# Patient Record
Sex: Female | Born: 1995 | Race: White | Hispanic: No | Smoking: Current every day smoker
Health system: Southern US, Community
[De-identification: ages and names within clinical notes are randomized; demographics above are authoritative.]

## PROBLEM LIST (undated history)

## (undated) ENCOUNTER — Inpatient Hospital Stay: Payer: Self-pay

## (undated) DIAGNOSIS — B009 Herpesviral infection, unspecified: Secondary | ICD-10-CM

## (undated) DIAGNOSIS — S92909A Unspecified fracture of unspecified foot, initial encounter for closed fracture: Secondary | ICD-10-CM

## (undated) DIAGNOSIS — J189 Pneumonia, unspecified organism: Secondary | ICD-10-CM

## (undated) DIAGNOSIS — R519 Headache, unspecified: Secondary | ICD-10-CM

## (undated) DIAGNOSIS — R51 Headache: Secondary | ICD-10-CM

## (undated) DIAGNOSIS — F1910 Other psychoactive substance abuse, uncomplicated: Secondary | ICD-10-CM

## (undated) HISTORY — PX: VAGINAL WOUND CLOSURE / REPAIR: SUR258

## (undated) HISTORY — DX: Herpesviral infection, unspecified: B00.9

## (undated) HISTORY — PX: WISDOM TOOTH EXTRACTION: SHX21

---
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
## 2014-09-24 ENCOUNTER — Emergency Department: Payer: Self-pay | Admitting: Student

## 2014-09-24 LAB — GC/CHLAMYDIA PROBE AMP

## 2014-09-24 LAB — WET PREP, GENITAL

## 2015-02-21 DIAGNOSIS — G43909 Migraine, unspecified, not intractable, without status migrainosus: Secondary | ICD-10-CM | POA: Diagnosis not present

## 2015-02-21 DIAGNOSIS — J019 Acute sinusitis, unspecified: Secondary | ICD-10-CM | POA: Insufficient documentation

## 2015-02-21 DIAGNOSIS — M791 Myalgia: Secondary | ICD-10-CM | POA: Insufficient documentation

## 2015-02-21 DIAGNOSIS — Z720 Tobacco use: Secondary | ICD-10-CM | POA: Diagnosis not present

## 2015-02-21 DIAGNOSIS — Z91040 Latex allergy status: Secondary | ICD-10-CM | POA: Insufficient documentation

## 2015-02-21 DIAGNOSIS — J029 Acute pharyngitis, unspecified: Secondary | ICD-10-CM | POA: Diagnosis present

## 2015-02-22 ENCOUNTER — Emergency Department: Payer: Medicaid Other

## 2015-02-22 ENCOUNTER — Emergency Department
Admission: EM | Admit: 2015-02-22 | Discharge: 2015-02-22 | Disposition: A | Discharge status: Home / Self Care | Payer: Medicaid Other | Attending: Emergency Medicine | Admitting: Emergency Medicine

## 2015-02-22 ENCOUNTER — Encounter: Payer: Self-pay | Admitting: Urgent Care

## 2015-02-22 DIAGNOSIS — M791 Myalgia, unspecified site: Secondary | ICD-10-CM

## 2015-02-22 DIAGNOSIS — J019 Acute sinusitis, unspecified: Secondary | ICD-10-CM

## 2015-02-22 DIAGNOSIS — G43909 Migraine, unspecified, not intractable, without status migrainosus: Secondary | ICD-10-CM

## 2015-02-22 LAB — CBC WITH DIFFERENTIAL/PLATELET
BASOS PCT: 1 %
Basophils Absolute: 0 10*3/uL (ref 0–0.1)
Eosinophils Absolute: 0 10*3/uL (ref 0–0.7)
Eosinophils Relative: 0 %
HCT: 40.4 % (ref 35.0–47.0)
HEMOGLOBIN: 13 g/dL (ref 12.0–16.0)
Lymphocytes Relative: 15 %
Lymphs Abs: 0.8 10*3/uL — ABNORMAL LOW (ref 1.0–3.6)
MCH: 28.8 pg (ref 26.0–34.0)
MCHC: 32.1 g/dL (ref 32.0–36.0)
MCV: 89.7 fL (ref 80.0–100.0)
MONO ABS: 0.7 10*3/uL (ref 0.2–0.9)
Monocytes Relative: 12 %
NEUTROS PCT: 72 %
Neutro Abs: 4.2 10*3/uL (ref 1.4–6.5)
PLATELETS: 226 10*3/uL (ref 150–440)
RBC: 4.5 MIL/uL (ref 3.80–5.20)
RDW: 13.5 % (ref 11.5–14.5)
WBC: 5.7 10*3/uL (ref 3.6–11.0)

## 2015-02-22 LAB — URINALYSIS COMPLETE WITH MICROSCOPIC (ARMC ONLY)
BILIRUBIN URINE: NEGATIVE
Glucose, UA: NEGATIVE mg/dL
LEUKOCYTES UA: NEGATIVE
Nitrite: NEGATIVE
PROTEIN: 30 mg/dL — AB
Specific Gravity, Urine: 1.025 (ref 1.005–1.030)
pH: 5 (ref 5.0–8.0)

## 2015-02-22 LAB — COMPREHENSIVE METABOLIC PANEL
ALK PHOS: 91 U/L (ref 38–126)
ALT: 49 U/L (ref 14–54)
AST: 34 U/L (ref 15–41)
Albumin: 4.4 g/dL (ref 3.5–5.0)
Anion gap: 8 (ref 5–15)
BUN: 9 mg/dL (ref 6–20)
CO2: 26 mmol/L (ref 22–32)
CREATININE: 0.72 mg/dL (ref 0.44–1.00)
Calcium: 9.2 mg/dL (ref 8.9–10.3)
Chloride: 105 mmol/L (ref 101–111)
GFR calc Af Amer: >60 mL/min (ref >60)
GLUCOSE: 128 mg/dL — AB (ref 65–99)
Potassium: 3.6 mmol/L (ref 3.5–5.1)
SODIUM: 139 mmol/L (ref 135–145)
Total Bilirubin: 0.2 mg/dL — ABNORMAL LOW (ref 0.3–1.2)
Total Protein: 8 g/dL (ref 6.5–8.1)

## 2015-02-22 LAB — TROPONIN I

## 2015-02-22 MED ORDER — METOCLOPRAMIDE HCL 5 MG/ML IJ SOLN
10.0000 mg | Freq: Once | INTRAMUSCULAR | Status: AC
  Administered 2015-02-22: 10 mg via INTRAVENOUS

## 2015-02-22 MED ORDER — DIPHENHYDRAMINE HCL 50 MG/ML IJ SOLN
INTRAMUSCULAR | Status: AC
  Administered 2015-02-22: 12.5 mg via INTRAVENOUS
  Filled 2015-02-22: qty 1

## 2015-02-22 MED ORDER — METOCLOPRAMIDE HCL 5 MG/ML IJ SOLN
INTRAMUSCULAR | Status: AC
  Administered 2015-02-22: 10 mg via INTRAVENOUS
  Filled 2015-02-22: qty 2

## 2015-02-22 MED ORDER — TRAMADOL HCL 50 MG PO TABS: 50.0000 mg | ORAL_TABLET | Freq: Four times a day (QID) | ORAL | Status: AC | PRN

## 2015-02-22 MED ORDER — AMOXICILLIN-POT CLAVULANATE 875-125 MG PO TABS: 1.0000 | ORAL_TABLET | Freq: Two times a day (BID) | ORAL | Status: AC

## 2015-02-22 MED ORDER — KETOROLAC TROMETHAMINE 30 MG/ML IJ SOLN
INTRAMUSCULAR | Status: AC
  Administered 2015-02-22: 30 mg via INTRAVENOUS
  Filled 2015-02-22: qty 1

## 2015-02-22 MED ORDER — DIPHENHYDRAMINE HCL 50 MG/ML IJ SOLN
12.5000 mg | Freq: Once | INTRAMUSCULAR | Status: AC
  Administered 2015-02-22: 12.5 mg via INTRAVENOUS

## 2015-02-22 MED ORDER — KETOROLAC TROMETHAMINE 30 MG/ML IJ SOLN
30.0000 mg | Freq: Once | INTRAMUSCULAR | Status: AC
  Administered 2015-02-22: 30 mg via INTRAVENOUS

## 2015-02-22 MED ORDER — SODIUM CHLORIDE 0.9 % IV BOLUS (SEPSIS)
1000.0000 mL | Freq: Once | INTRAVENOUS | Status: AC
  Administered 2015-02-22: 1000 mL via INTRAVENOUS

## 2015-02-22 NOTE — ED Notes (Signed)
Patient presents to ED with c/o cough, shortness of breath, light headed, fevers, and generalized weakness x 5 days. Patient reports has productive cough with white, clear, or at times pink sputum. Patient reports family member also being sick with similar symptoms. Patient also c/o nausea, vomiting, and diarrhea, reports at time had blood in stool. Patient alert and oriented, respirations even and unlabored. Patient + for congested cough. Denies blurry vision, or other symptoms at this time.

## 2015-02-22 NOTE — ED Provider Notes (Signed)
[facility] Emergency Department Provider Note  ____________________________________________  Time seen: Approximately 5:06 AM  I have reviewed the triage vital signs and the nursing notes.   HISTORY  Chief Complaint Cough; Nasal Congestion; Sore Throat; Generalized Body Aches; Hematemesis; and Melena    HPI Tiffany Sherman is a [age] y.o. female who comes in with 6 days of congestion sore throat and body aches. She reports that today she developed some blood in her stool vomiting and coughing up blood. The patient reports that whenever she coughs she would have chunks of blood in it and that whenever she vomited it was pink tissue noted was blood. The patient reports that she vomits every time she eats and she has been unable to eat anything. The patient took some Benadryl yesterday to try to sleep and took some ibuprofen 500 mg 2 doses which did not help. The patient reports that she is unable to sleep. At home her temperature was 103. Her by group is no reports that it hurts to do anything. [facility] for [facility] appointment and was told that she could not be seen until next week. The patient reports that she has some shortness of breath and chest pain and it hurts when she coughs and breathes. She has had some vomiting and diarrhea. She reports that her abdomen hurts all over and that she has sharper pain under her left rib. She reports her cousin is also sick.   History reviewed. No pertinent past medical history.  There are no active problems to display for this patient.   History reviewed. No pertinent past surgical history.  Current Outpatient Rx  Name  Route  Sig  Dispense  Refill  . amoxicillin-clavulanate (AUGMENTIN) 875-125 MG per tablet   Oral   Take 1 tablet by mouth 2 (two) times daily.   14 tablet   0   . traMADol (ULTRAM) 50 MG tablet   Oral   Take 1 tablet (50 mg total) by mouth every 6 (six) hours as needed.   20 tablet   0      Allergies Bee venom; Calamine; and Latex  No family history on file.  Social History History  Substance Use Topics  . Smoking status: Current Every Day Smoker  . Smokeless tobacco: Not on file  . Alcohol Use: No    Review of Systems Constitutional: Fever and chills Eyes: No visual changes. WUJ:WJXBENT:sore throat. Cardiovascular: chest pain. Respiratory: shortness of breath. Gastrointestinal: Abdominal pain, nausea, vomiting, diarrhea. Genitourinary: Negative for dysuria. Musculoskeletal: Myalgia Skin: Negative for rash. Neurological: Headaches  10-point ROS otherwise negative.  ____________________________________________   PHYSICAL EXAM:  VITAL SIGNS: ED Triage Vitals  Enc Vitals Group     BP 02/22/15 0011 133/83 mmHg     Pulse Rate 02/22/15 0011 86     Resp 02/22/15 0011 18     Temp 02/22/15 0011 99.2 F (37.3 C)     Temp Source 02/22/15 0011 Oral     SpO2 02/22/15 0011 96 %     Weight 02/22/15 0011 130 lb (58.968 kg)     Height 02/22/15 0011 5\' 9"  (1.753 m)     Head Cir --      Peak Flow --      Pain Score 02/22/15 0012 10     Pain Loc --      Pain Edu? --      Excl. in GC? --     Constitutional: Alert and oriented. Well  appearing and in mild distress. Eyes: Conjunctivae are normal. PERRL. EOMI. Head: Atraumatic. Nose: Congestion Mouth/Throat: Mucous membranes are moist.  Oropharynx non-erythematous. Cardiovascular: Normal rate, regular rhythm. Grossly normal heart sounds.  Good peripheral circulation. Respiratory: Normal respiratory effort.  No retractions. Lungs CTAB. Gastrointestinal: Soft and nontender. No distention. Positive bowel sounds Genitourinary: Deferred Musculoskeletal: No lower extremity tenderness nor edema.  No joint effusions. Neurologic:  Normal speech and language. No gross focal neurologic deficits are appreciated. Skin:  Skin is warm, dry and intact. No rash noted. Psychiatric: Mood and affect are normal.    ____________________________________________   LABS (all labs ordered are listed, but only abnormal results are displayed)  Labs Reviewed  CBC WITH DIFFERENTIAL/PLATELET - Abnormal; Notable for the following:    Lymphs Abs 0.8 (*)    All other components within normal limits  COMPREHENSIVE METABOLIC PANEL - Abnormal; Notable for the following:    Glucose, Bld 128 (*)    Total Bilirubin 0.2 (*)    All other components within normal limits  URINALYSIS COMPLETEWITH MICROSCOPIC ([facility] ONLY) - Abnormal; Notable for the following:    Color, Urine YELLOW (*)    APPearance CLEAR (*)    Ketones, ur 2+ (*)    Hgb urine dipstick 1+ (*)    Protein, ur 30 (*)    Bacteria, UA RARE (*)    Squamous Epithelial / LPF 0-5 (*)    All other components within normal limits  CULTURE, GROUP A STREP ([facility] ONLY)  TROPONIN I  POC URINE PREG, ED   ____________________________________________  EKG  None ____________________________________________  RADIOLOGY  Chest x-ray ____________________________________________   PROCEDURES  Procedure(s) performed: None  Critical Care performed: No  ____________________________________________   INITIAL IMPRESSION / ASSESSMENT AND PLAN / ED COURSE  Pertinent labs & imaging results that were available during my care of the patient were reviewed by me and considered in my medical decision making (see chart for details).  The patient is a 19 year old female who comes in today with body aches congestion and not feeling well. The patient reports that she did have some hematemesis and melena but that was negative on the guaiac. I will give the patient some fluids, Toradol and medication for migraines as well as check a chest x-ray then reassess the patient.  The patient will be discharged home to follow-up with her primary care physician for further evaluation. I will discharge the patient with cyanotic for her sinusitis and  bodyaches. ____________________________________________   FINAL CLINICAL IMPRESSION(S) / ED DIAGNOSES  Final diagnoses:  Migraine without status migrainosus, not intractable, unspecified migraine type  Muscle ache  Acute sinusitis, recurrence not specified, unspecified location      Rebecka Apley, MD 02/22/15 [phone]

## 2015-02-22 NOTE — Discharge Instructions (Signed)
Headaches, Frequently Asked Questions °MIGRAINE HEADACHES °Q: What is migraine? What causes it? How can I treat it? °A: Generally, migraine headaches begin as a dull ache. Then they develop into a constant, throbbing, and pulsating pain. You may experience pain at the temples. You may experience pain at the front or back of one or both sides of the head. The pain is usually accompanied by a combination of: °· Nausea. °· Vomiting. °· Sensitivity to light and noise. °Some people (about 15%) experience an aura (see below) before an attack. The cause of migraine is believed to be chemical reactions in the brain. Treatment for migraine may include over-the-counter or prescription medications. It may also include self-help techniques. These include relaxation training and biofeedback.  °Q: What is an aura? °A: About 15% of people with migraine get an "aura". This is a sign of neurological symptoms that occur before a migraine headache. You may see wavy or jagged lines, dots, or flashing lights. You might experience tunnel vision or blind spots in one or both eyes. The aura can include visual or auditory hallucinations (something imagined). It may include disruptions in smell (such as strange odors), taste or touch. Other symptoms include: °· Numbness. °· A "pins and needles" sensation. °· Difficulty in recalling or speaking the correct word. °These neurological events may last as long as 60 minutes. These symptoms will fade as the headache begins. °Q: What is a trigger? °A: Certain physical or environmental factors can lead to or "trigger" a migraine. These include: °· Foods. °· Hormonal changes. °· Weather. °· Stress. °It is important to remember that triggers are different for everyone. To help prevent migraine attacks, you need to figure out which triggers affect you. Keep a headache diary. This is a good way to track triggers. The diary will help you talk to your healthcare professional about your condition. °Q: Does  weather affect migraines? °A: Bright sunshine, hot, humid conditions, and drastic changes in barometric pressure may lead to, or "trigger," a migraine attack in some people. But studies have shown that weather does not act as a trigger for everyone with migraines. °Q: What is the link between migraine and hormones? °A: Hormones start and regulate many of your body's functions. Hormones keep your body in balance within a constantly changing environment. The levels of hormones in your body are unbalanced at times. Examples are during menstruation, pregnancy, or menopause. That can lead to a migraine attack. In fact, about three quarters of all women with migraine report that their attacks are related to the menstrual cycle.  °Q: Is there an increased risk of stroke for migraine sufferers? °A: The likelihood of a migraine attack causing a stroke is very remote. That is not to say that migraine sufferers cannot have a stroke associated with their migraines. In persons under age 40, the most common associated factor for stroke is migraine headache. But over the course of a person's normal life span, the occurrence of migraine headache may actually be associated with a reduced risk of dying from cerebrovascular disease due to stroke.  °Q: What are acute medications for migraine? °A: Acute medications are used to treat the pain of the headache after it has started. Examples over-the-counter medications, NSAIDs, ergots, and triptans.  °Q: What are the triptans? °A: Triptans are the newest class of abortive medications. They are specifically targeted to treat migraine. Triptans are vasoconstrictors. They moderate some chemical reactions in the brain. The triptans work on receptors in your brain. Triptans help   to restore the balance of a neurotransmitter called serotonin. Fluctuations in levels of serotonin are thought to be a main cause of migraine.  °Q: Are over-the-counter medications for migraine effective? °A:  Over-the-counter, or "OTC," medications may be effective in relieving mild to moderate pain and associated symptoms of migraine. But you should see your caregiver before beginning any treatment regimen for migraine.  °Q: What are preventive medications for migraine? °A: Preventive medications for migraine are sometimes referred to as "prophylactic" treatments. They are used to reduce the frequency, severity, and length of migraine attacks. Examples of preventive medications include antiepileptic medications, antidepressants, beta-blockers, calcium channel blockers, and NSAIDs (nonsteroidal anti-inflammatory drugs). °Q: Why are anticonvulsants used to treat migraine? °A: During the past few years, there has been an increased interest in antiepileptic drugs for the prevention of migraine. They are sometimes referred to as "anticonvulsants". Both epilepsy and migraine may be caused by similar reactions in the brain.  °Q: Why are antidepressants used to treat migraine? °A: Antidepressants are typically used to treat people with depression. They may reduce migraine frequency by regulating chemical levels, such as serotonin, in the brain.  °Q: What alternative therapies are used to treat migraine? °A: The term "alternative therapies" is often used to describe treatments considered outside the scope of conventional Western medicine. Examples of alternative therapy include acupuncture, acupressure, and yoga. Another common alternative treatment is herbal therapy. Some herbs are believed to relieve headache pain. Always discuss alternative therapies with your caregiver before proceeding. Some herbal products contain arsenic and other toxins. °TENSION HEADACHES °Q: What is a tension-type headache? What causes it? How can I treat it? °A: Tension-type headaches occur randomly. They are often the result of temporary stress, anxiety, fatigue, or anger. Symptoms include soreness in your temples, a tightening band-like sensation  around your head (a "vice-like" ache). Symptoms can also include a pulling feeling, pressure sensations, and contracting head and neck muscles. The headache begins in your forehead, temples, or the back of your head and neck. Treatment for tension-type headache may include over-the-counter or prescription medications. Treatment may also include self-help techniques such as relaxation training and biofeedback. °CLUSTER HEADACHES °Q: What is a cluster headache? What causes it? How can I treat it? °A: Cluster headache gets its name because the attacks come in groups. The pain arrives with little, if any, warning. It is usually on one side of the head. A tearing or bloodshot eye and a runny nose on the same side of the headache may also accompany the pain. Cluster headaches are believed to be caused by chemical reactions in the brain. They have been described as the most severe and intense of any headache type. Treatment for cluster headache includes prescription medication and oxygen. °SINUS HEADACHES °Q: What is a sinus headache? What causes it? How can I treat it? °A: When a cavity in the bones of the face and skull (a sinus) becomes inflamed, the inflammation will cause localized pain. This condition is usually the result of an allergic reaction, a tumor, or an infection. If your headache is caused by a sinus blockage, such as an infection, you will probably have a fever. An x-ray will confirm a sinus blockage. Your caregiver's treatment might include antibiotics for the infection, as well as antihistamines or decongestants.  °REBOUND HEADACHES °Q: What is a rebound headache? What causes it? How can I treat it? °A: A pattern of taking acute headache medications too often can lead to a condition known as "rebound headache."   A pattern of taking too much headache medication includes taking it more than 2 days per week or in excessive amounts. That means more than the label or a caregiver advises. With rebound  headaches, your medications not only stop relieving pain, they actually begin to cause headaches. Doctors treat rebound headache by tapering the medication that is being overused. Sometimes your caregiver will gradually substitute a different type of treatment or medication. Stopping may be a challenge. Regularly overusing a medication increases the potential for serious side effects. Consult a caregiver if you regularly use headache medications more than 2 days per week or more than the label advises. ADDITIONAL QUESTIONS AND ANSWERS Q: What is biofeedback? A: Biofeedback is a self-help treatment. Biofeedback uses special equipment to monitor your body's involuntary physical responses. Biofeedback monitors:  Breathing.  Pulse.  Heart rate.  Temperature.  Muscle tension.  Brain activity. Biofeedback helps you refine and perfect your relaxation exercises. You learn to control the physical responses that are related to stress. Once the technique has been mastered, you do not need the equipment any more. Q: Are headaches hereditary? A: Four out of five (80%) of people that suffer report a family history of migraine. Scientists are not sure if this is genetic or a family predisposition. Despite the uncertainty, a child has a 50% chance of having migraine if one parent suffers. The child has a 75% chance if both parents suffer.  Q: Can children get headaches? A: By the time they reach high school, most young people have experienced some type of headache. Many safe and effective approaches or medications can prevent a headache from occurring or stop it after it has begun.  Q: What type of doctor should I see to diagnose and treat my headache? A: Start with your primary caregiver. Discuss his or her experience and approach to headaches. Discuss methods of classification, diagnosis, and treatment. Your caregiver may decide to recommend you to a headache specialist, depending upon your symptoms or other  physical conditions. Having diabetes, allergies, etc., may require a more comprehensive and inclusive approach to your headache. The National Head[employer] Foundation will provide, upon request, a list of [facility] physician members in your state. Document Released: 11/30/2003 Document Revised: 12/02/2011 Document Reviewed: 05/09/2008 [facility] Patient Information 2015 [place], [place]. This information is not intended to replace advice given to you by your health care provider. Make sure you discuss any questions you have with your health care provider.  Sinusitis Sinusitis is redness, soreness, and puffiness (inflammation) of the air pockets in the bones of your face (sinuses). The redness, soreness, and puffiness can cause air and mucus to get trapped in your sinuses. This can allow germs to grow and cause an infection.  HOME CARE   Drink enough fluids to keep your pee (urine) clear or pale yellow.  Use a humidifier in your home.  Run a hot shower to create steam in the bathroom. Sit in the bathroom with the door closed. Breathe in the steam 3-4 times a day.  Put a warm, moist washcloth on your face 3-4 times a day, or as told by your doctor.  Use salt water sprays (saline sprays) to wet the thick fluid in your nose. This can help the sinuses drain.  Only take medicine as told by your doctor. GET HELP RIGHT AWAY IF:   Your pain gets worse.  You have very bad headaches.  You are sick to your stomach (nauseous).  You throw up (vomit).  You are  very sleepy (drowsy) all the time.  Your face is puffy (swollen).  Your vision changes.  You have a stiff neck.  You have trouble breathing. MAKE SURE YOU:   Understand these instructions.  Will watch your condition.  Will get help right away if you are not doing well or get worse. Document Released: 02/26/2008 Document Revised: 06/03/2012 Document Reviewed: 04/14/2012 [facility] Patient Information 2015 [place], [place]. This information is not  intended to replace advice given to you by your health care provider. Make sure you discuss any questions you have with your health care provider.  Musculoskeletal Pain Musculoskeletal pain is muscle and boney aches and pains. These pains can occur in any part of the body. Your caregiver may treat you without knowing the cause of the pain. They may treat you if blood or urine tests, X-rays, and other tests were normal.  CAUSES There is often not a definite cause or reason for these pains. These pains may be caused by a type of germ (virus). The discomfort may also come from overuse. Overuse includes working out too hard when your body is not fit. Boney aches also come from weather changes. Bone is sensitive to atmospheric pressure changes. HOME CARE INSTRUCTIONS   Ask when your test results will be ready. Make sure you get your test results.  Only take over-the-counter or prescription medicines for pain, discomfort, or fever as directed by your caregiver. If you were given medications for your condition, do not drive, operate machinery or power tools, or sign legal documents for 24 hours. Do not drink alcohol. Do not take sleeping pills or other medications that may interfere with treatment.  Continue all activities unless the activities cause more pain. When the pain lessens, slowly resume normal activities. Gradually increase the intensity and duration of the activities or exercise.  During periods of severe pain, bed rest may be helpful. Lay or sit in any position that is comfortable.  Putting ice on the injured area.  Put ice in a bag.  Place a towel between your skin and the bag.  Leave the ice on for 15 to 20 minutes, 3 to 4 times a day.  Follow up with your caregiver for continued problems and no reason can be found for the pain. If the pain becomes worse or does not go away, it may be necessary to repeat tests or do additional testing. Your caregiver may need to look further for a  possible cause. SEEK IMMEDIATE MEDICAL CARE IF:  You have pain that is getting worse and is not relieved by medications.  You develop chest pain that is associated with shortness or breath, sweating, feeling sick to your stomach (nauseous), or throw up (vomit).  Your pain becomes localized to the abdomen.  You develop any new symptoms that seem different or that concern you. MAKE SURE YOU:   Understand these instructions.  Will watch your condition.  Will get help right away if you are not doing well or get worse. Document Released: 09/09/2005 Document Revised: 12/02/2011 Document Reviewed: 05/14/2013 [employer] Patient Information 2015 [place], [place]. This information is not intended to replace advice given to you by your health care provider. Make sure you discuss any questions you have with your health care provider.

## 2015-02-22 NOTE — ED Notes (Signed)
Patient presents with multiple c/o. Patient with c/o cough with (+) hemoptysis, sore throat, congestion, myalgias, hematemesis and melena that has been going on x 5 days; worsening. (+) fever today with a tmax of 103.

## 2015-02-24 LAB — CULTURE, GROUP A STREP (THRC)

## 2015-04-04 LAB — HM HIV SCREENING LAB: HM HIV Screening: NEGATIVE

## 2015-10-02 ENCOUNTER — Ambulatory Visit: Payer: Self-pay | Admitting: Family Medicine

## 2016-01-17 ENCOUNTER — Encounter: Payer: Self-pay | Admitting: *Deleted

## 2016-01-17 ENCOUNTER — Emergency Department
Admission: EM | Admit: 2016-01-17 | Discharge: 2016-01-17 | Disposition: A | Discharge status: Home / Self Care | Payer: Medicaid Other | Attending: Emergency Medicine | Admitting: Emergency Medicine

## 2016-01-17 DIAGNOSIS — F17210 Nicotine dependence, cigarettes, uncomplicated: Secondary | ICD-10-CM | POA: Insufficient documentation

## 2016-01-17 DIAGNOSIS — H1033 Unspecified acute conjunctivitis, bilateral: Secondary | ICD-10-CM | POA: Insufficient documentation

## 2016-01-17 DIAGNOSIS — Z3202 Encounter for pregnancy test, result negative: Secondary | ICD-10-CM | POA: Insufficient documentation

## 2016-01-17 DIAGNOSIS — H10023 Other mucopurulent conjunctivitis, bilateral: Secondary | ICD-10-CM

## 2016-01-17 LAB — POCT PREGNANCY, URINE: PREG TEST UR: NEGATIVE

## 2016-01-17 MED ORDER — CIPROFLOXACIN HCL 0.3 % OP SOLN: 1.0000 [drp] | OPHTHALMIC | Status: AC

## 2016-01-17 NOTE — ED Provider Notes (Signed)
[facility] Emergency Department Provider Note  ____________________________________________  Time seen: Approximately 2:44 PM  I have reviewed the triage vital signs and the nursing notes.   HISTORY  Chief Complaint Conjunctivitis    HPI Tiffany Sherman is a 20 y.o. female who presents emergency department complaining of bilateral eye redness. Patient states that her right eye has been erythematous, draining thick mucoid drainage 6 days. She states that her left eye is starting to have same symptoms over the last 24 hours. Patient reports burning/itching sensation. She denies wearing glasses or contacts. Patient reports that her boyfriend has same condition.  Patient is also requesting pregnancy test. She states that she was on double shots and was supposed to have a another shot the last part of February. She states that she missed it and has not had a period since. She is requesting pregnancy test today.   History reviewed. No pertinent past medical history.  There are no active problems to display for this patient.   No past surgical history on file.  Current Outpatient Rx  Name  Route  Sig  Dispense  Refill  . ciprofloxacin (CILOXAN) 0.3 % ophthalmic solution   Both Eyes   Place 1 drop into both eyes every 2 (two) hours. Administer 1 drop, every 2 hours, while awake, for 2 days. Then 1 drop, every 4 hours, while awake, for the next 5 days.   5 mL   0   . traMADol (ULTRAM) 50 MG tablet   Oral   Take 1 tablet (50 mg total) by mouth every 6 (six) hours as needed.   20 tablet   0     Allergies Bee venom; Calamine; and Latex  No family history on file.  Social History Social History  Substance Use Topics  . Smoking status: Current Every Day Smoker -- 1.00 packs/day    Types: Cigarettes  . Smokeless tobacco: None  . Alcohol Use: No     Review of Systems  Constitutional: No fever/chills Eyes: No visual changes. Positive for  Conjunctival irritation. Positive purulent discharge ENT: No sore throat. Denies nasal congestion. Denies ear pain. Cardiovascular: no chest pain. Respiratory: no cough. No SOB. Musculoskeletal: Negative for musculoskeletal pain. Skin: Negative for rash. Neurological: Negative for headaches, focal weakness or numbness. 10-point ROS otherwise negative.  ____________________________________________   PHYSICAL EXAM:  VITAL SIGNS: ED Triage Vitals  Enc Vitals Group     BP 01/17/16 1429 127/72 mmHg     Pulse Rate 01/17/16 1429 106     Resp 01/17/16 1429 20     Temp 01/17/16 1429 98.1 F (36.7 C)     Temp Source 01/17/16 1429 Oral     SpO2 01/17/16 1429 99 %     Weight 01/17/16 1429 150 lb (68.04 kg)     Height 01/17/16 1429 5\' 7"  (1.702 m)     Head Cir --      Peak Flow --      Pain Score --      Pain Loc --      Pain Edu? --      Excl. in GC? --      Constitutional: Alert and oriented. Well appearing and in no acute distress. Eyes: Conjunctivae erythematous on right. Purulent drainage noted on lower eyelashes right eye. No conjunctival erythema on left side. Mild purulent drainage is noted on lower eyelashes.Marland Kitchen. PERRL. EOMI. Head: Atraumatic. ENT:      Ears:       Nose:  No congestion/rhinnorhea.      Mouth/Throat: Mucous membranes are moist.  Neck: No stridor.   Cardiovascular: Normal rate, regular rhythm. Normal S1 and S2.  Good peripheral circulation. Respiratory: Normal respiratory effort without tachypnea or retractions. Lungs CTAB. Neurologic:  Normal speech and language. No gross focal neurologic deficits are appreciated.  Skin:  Skin is warm, dry and intact. No rash noted. Psychiatric: Mood and affect are normal. Speech and behavior are normal. Patient exhibits appropriate insight and judgement.   ____________________________________________   LABS (all labs ordered are listed, but only abnormal results are displayed)  Labs Reviewed  POC URINE PREG, ED  POCT  PREGNANCY, URINE   ____________________________________________  EKG   ____________________________________________  RADIOLOGY   No results found.  ____________________________________________    PROCEDURES  Procedure(s) performed:       Medications - No data to display   ____________________________________________   INITIAL IMPRESSION / ASSESSMENT AND PLAN / ED COURSE  Pertinent labs & imaging results that were available during my care of the patient were reviewed by me and considered in my medical decision making (see chart for details).  Patient's diagnosis is consistent with bacterial conjunctivitis bilateral eyes. Patient requested a pregnancy test here in the emergency department which was negative.. Patient will be discharged home with prescriptions for antibiotic eyedrops. Patient is to follow up with ophthalmology if symptoms persist past this treatment course. Patient is given ED precautions to return to the ED for any worsening or new symptoms.     ____________________________________________  FINAL CLINICAL IMPRESSION(S) / ED DIAGNOSES  Final diagnoses:  Acute bacterial conjunctivitis, bilateral  Negative pregnancy test      NEW MEDICATIONS STARTED DURING THIS VISIT:  New Prescriptions   CIPROFLOXACIN (CILOXAN) 0.3 % OPHTHALMIC SOLUTION    Place 1 drop into both eyes every 2 (two) hours. Administer 1 drop, every 2 hours, while awake, for 2 days. Then 1 drop, every 4 hours, while awake, for the next 5 days.        This chart was dictated using voice recognition software/Dragon. Despite best efforts to proofread, errors can occur which can change the meaning. Any change was purely unintentional.    Racheal Patches, PA-C 01/17/16 1508  Rockne Menghini, MD 01/17/16 1610

## 2016-01-17 NOTE — ED Notes (Signed)
Pt right eye is red and irritated

## 2016-01-17 NOTE — Discharge Instructions (Signed)
Bacterial Conjunctivitis °Bacterial conjunctivitis, commonly called pink eye, is an inflammation of the clear membrane that covers the white part of the eye (conjunctiva). The inflammation can also happen on the underside of the eyelids. The blood vessels in the conjunctiva become inflamed, causing the eye to become red or pink. Bacterial conjunctivitis may spread easily from one eye to another and from person to person (contagious).  °CAUSES  °Bacterial conjunctivitis is caused by bacteria. The bacteria may come from your own skin, your upper respiratory tract, or from someone else with bacterial conjunctivitis. °SYMPTOMS  °The normally white color of the eye or the underside of the eyelid is usually pink or red. The pink eye is usually associated with irritation, tearing, and some sensitivity to light. Bacterial conjunctivitis is often associated with a thick, yellowish discharge from the eye. The discharge may turn into a crust on the eyelids overnight, which causes your eyelids to stick together. If a discharge is present, there may also be some blurred vision in the affected eye. °DIAGNOSIS  °Bacterial conjunctivitis is diagnosed by your caregiver through an eye exam and the symptoms that you report. Your caregiver looks for changes in the surface tissues of your eyes, which may point to the specific type of conjunctivitis. A sample of any discharge may be collected on a cotton-tip swab if you have a severe case of conjunctivitis, if your cornea is affected, or if you keep getting repeat infections that do not respond to treatment. The sample will be sent to a lab to see if the inflammation is caused by a bacterial infection and to see if the infection will respond to antibiotic medicines. °TREATMENT  °1. Bacterial conjunctivitis is treated with antibiotics. Antibiotic eyedrops are most often used. However, antibiotic ointments are also available. Antibiotics pills are sometimes used. Artificial tears or eye  washes may ease discomfort. °HOME CARE INSTRUCTIONS  °1. To ease discomfort, apply a cool, clean washcloth to your eye for 10-20 minutes, 3-4 times a day. °2. Gently wipe away any drainage from your eye with a warm, wet washcloth or a cotton ball. °3. Wash your hands often with soap and water. Use paper towels to dry your hands. °4. Do not share towels or washcloths. This may spread the infection. °5. Change or wash your pillowcase every day. °6. You should not use eye makeup until the infection is gone. °7. Do not operate machinery or drive if your vision is blurred. °8. Stop using contact lenses. Ask your caregiver how to sterilize or replace your contacts before using them again. This depends on the type of contact lenses that you use. °9. When applying medicine to the infected eye, do not touch the edge of your eyelid with the eyedrop bottle or ointment tube. °SEEK IMMEDIATE MEDICAL CARE IF:  °· Your infection has not improved within 3 days after beginning treatment. °· You had yellow discharge from your eye and it returns. °· You have increased eye pain. °· Your eye redness is spreading. °· Your vision becomes blurred. °· You have a fever or persistent symptoms for more than 2-3 days. °· You have a fever and your symptoms suddenly get worse. °· You have facial pain, redness, or swelling. °MAKE SURE YOU:  °· Understand these instructions. °· Will watch your condition. °· Will get help right away if you are not doing well or get worse. °  °This information is not intended to replace advice given to you by your health care provider. Make sure you   discuss any questions you have with your health care provider. °  °Document Released: 09/09/2005 Document Revised: 09/30/2014 Document Reviewed: 02/10/2012 °Elsevier Interactive Patient Education ©2016 [employer] ° °How to Use Eye Drops and Eye Ointments °HOW TO APPLY EYE DROPS °Follow these steps when applying eye drops: °2. Wash your hands. °3. Tilt your head  back. °4. Put a finger under your eye and use it to gently pull your lower lid downward. Keep that finger in place. °5. Using your other hand, hold the dropper between your thumb and index finger. °6. Position the dropper just over the edge of the lower lid. Hold it as close to your eye as you can without touching the dropper to your eye. °7. Steady your hand. One way to do this is to lean your index finger against your brow. °8. Look up. °9. Slowly and gently squeeze one drop of medicine into your eye. °10. Close your eye. °11. Place a finger between your lower eyelid and your nose. Press gently for 2 minutes. This increases the amount of time that the medicine is exposed to the eye. It also reduces side effects that can develop if the drop gets into the bloodstream through the nose. °HOW TO APPLY EYE OINTMENTS °Follow these steps when applying eye ointments: °10. Wash your hands. °11. Put a finger under your eye and use it to gently pull your lower lid downward. Keep that finger in place. °12. Using your other hand, place the tip of the tube between your thumb and index finger with the remaining fingers braced against your cheek or nose. °13. Hold the tube just over the edge of your lower lid without touching the tube to your lid or eyeball. °14. Look up. °15. Line the inner part of your lower lid with ointment. °16. Gently pull up on your upper lid and look down. This will force the ointment to spread over the surface of the eye. °17. Release the upper lid. °18. If you can, close your eyes for 1-2 minutes. °Do not rub your eyes. If you applied the ointment correctly, your vision will be blurry for a few minutes. This is normal. °ADDITIONAL INFORMATION °· Make sure to use the eye drops or ointment as told by your health care provider. °· If you have been told to use both eye drops and an eye ointment, apply the eye drops first, then wait 3-4 minutes before you apply the ointment. °· Try not to touch the tip of the  dropper or tube to your eye. A dropper or tube that has touched the eye can become contaminated. °  °This information is not intended to replace advice given to you by your health care provider. Make sure you discuss any questions you have with your health care provider. °  °Document Released: 12/16/2000 Document Revised: 01/24/2015 Document Reviewed: 09/05/2014 °Elsevier Interactive Patient Education ©2016 Elsevier Inc. ° °

## 2016-01-17 NOTE — ED Notes (Signed)
See triage   Right red and irritated with some drainage

## 2016-06-26 ENCOUNTER — Encounter: Payer: Self-pay | Admitting: Intensive Care

## 2016-06-26 ENCOUNTER — Emergency Department
Admission: EM | Admit: 2016-06-26 | Discharge: 2016-06-26 | Disposition: A | Discharge status: Home / Self Care | Payer: Medicaid Other | Attending: Emergency Medicine | Admitting: Emergency Medicine

## 2016-06-26 DIAGNOSIS — Z4802 Encounter for removal of sutures: Secondary | ICD-10-CM | POA: Diagnosis not present

## 2016-06-26 DIAGNOSIS — M79662 Pain in left lower leg: Secondary | ICD-10-CM | POA: Insufficient documentation

## 2016-06-26 DIAGNOSIS — F17210 Nicotine dependence, cigarettes, uncomplicated: Secondary | ICD-10-CM | POA: Insufficient documentation

## 2016-06-26 DIAGNOSIS — M79605 Pain in left leg: Secondary | ICD-10-CM

## 2016-06-26 DIAGNOSIS — Z4689 Encounter for fitting and adjustment of other specified devices: Secondary | ICD-10-CM | POA: Diagnosis present

## 2016-06-26 DIAGNOSIS — Z4789 Encounter for other orthopedic aftercare: Secondary | ICD-10-CM

## 2016-06-26 MED ORDER — BACITRACIN ZINC 500 UNIT/GM EX OINT
1.0000 "application " | TOPICAL_OINTMENT | Freq: Once | CUTANEOUS | Status: AC
  Administered 2016-06-26: 1 via TOPICAL
  Filled 2016-06-26: qty 0.9

## 2016-06-26 MED ORDER — CYCLOBENZAPRINE HCL 10 MG PO TABS: 10.0000 mg | ORAL_TABLET | Freq: Three times a day (TID) | ORAL | 0 refills | Status: DC | PRN

## 2016-06-26 NOTE — Discharge Instructions (Signed)
Use the crutches as advised.  Follow up with orthopedics as scheduled.  Take the flexeril in addition to the medications prescribed at [facility] if needed.

## 2016-06-26 NOTE — ED Notes (Signed)
Signature pad not working.  Patient understands discharge instructions and prescriptions and has no further questions.
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
## 2016-10-18 ENCOUNTER — Ambulatory Visit: Payer: Self-pay | Admitting: Podiatry

## 2017-02-04 ENCOUNTER — Ambulatory Visit (INDEPENDENT_AMBULATORY_CARE_PROVIDER_SITE_OTHER): Payer: Medicaid Other | Admitting: Podiatry

## 2017-02-04 ENCOUNTER — Ambulatory Visit: Payer: Medicaid Other

## 2017-02-04 DIAGNOSIS — M79672 Pain in left foot: Secondary | ICD-10-CM

## 2017-02-04 DIAGNOSIS — M778 Other enthesopathies, not elsewhere classified: Secondary | ICD-10-CM

## 2017-02-04 DIAGNOSIS — M7752 Other enthesopathy of left foot: Secondary | ICD-10-CM

## 2017-02-04 DIAGNOSIS — M779 Enthesopathy, unspecified: Secondary | ICD-10-CM

## 2017-02-04 DIAGNOSIS — M659 Synovitis and tenosynovitis, unspecified: Secondary | ICD-10-CM

## 2017-02-04 DIAGNOSIS — Z8781 Personal history of (healed) traumatic fracture: Secondary | ICD-10-CM

## 2017-02-04 MED ORDER — MELOXICAM 15 MG PO TABS: 15.0000 mg | ORAL_TABLET | Freq: Every day | ORAL | 3 refills | Status: DC
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
## 2017-02-08 MED ORDER — BETAMETHASONE SOD PHOS & ACET 6 (3-3) MG/ML IJ SUSP: 3.0000 mg | Freq: Once | INTRAMUSCULAR | Status: DC

## 2017-03-04 ENCOUNTER — Ambulatory Visit: Payer: Medicaid Other | Admitting: Podiatry

## 2017-03-14 ENCOUNTER — Ambulatory Visit (INDEPENDENT_AMBULATORY_CARE_PROVIDER_SITE_OTHER): Payer: Medicaid Other | Admitting: Podiatry

## 2017-03-14 DIAGNOSIS — M7752 Other enthesopathy of left foot: Secondary | ICD-10-CM

## 2017-03-14 DIAGNOSIS — M659 Synovitis and tenosynovitis, unspecified: Secondary | ICD-10-CM

## 2017-03-14 DIAGNOSIS — Z8781 Personal history of (healed) traumatic fracture: Secondary | ICD-10-CM

## 2017-03-14 DIAGNOSIS — M779 Enthesopathy, unspecified: Secondary | ICD-10-CM

## 2017-03-14 DIAGNOSIS — M778 Other enthesopathies, not elsewhere classified: Secondary | ICD-10-CM
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
## 2017-03-17 ENCOUNTER — Telehealth: Payer: Self-pay | Admitting: *Deleted

## 2017-03-17 DIAGNOSIS — Z8781 Personal history of (healed) traumatic fracture: Secondary | ICD-10-CM

## 2017-03-17 DIAGNOSIS — M7752 Other enthesopathy of left foot: Secondary | ICD-10-CM

## 2017-03-17 DIAGNOSIS — M659 Synovitis and tenosynovitis, unspecified: Secondary | ICD-10-CM
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
## 2017-03-25 NOTE — Telephone Encounter (Signed)
"  I was told to call you to schedule my MRI by someone in our [place] office."  I don't schedule MRIs but I will see if I can help you.  I see that it needed authorization.  Has it been authorized?  "Yes, it has been authorized."   Did you receive a requisition telling you where you would need to go for the MRI?  "No, I didn't receive any information."  You should have received information about where to go from our [place] office.   Normally someone from the Imaging place would call you to schedule the MRI.  I can give you the number to [facility] and you can see if you can schedule your MRI.  Their number is [phone].

## 2017-04-02 ENCOUNTER — Ambulatory Visit: Payer: Medicaid Other

## 2017-04-02 ENCOUNTER — Telehealth: Payer: Self-pay

## 2017-04-02 NOTE — Telephone Encounter (Signed)
Spoke with April G. Nurse case manager with [facility], MRI left ankle and left foot has been approved from 03/19/18 to 04/18/17  Auth # [id number]  Patient MRI is scheduled for 04/07/17 at 5pm

## 2017-04-07 ENCOUNTER — Ambulatory Visit
Admission: RE | Admit: 2017-04-07 | Discharge: 2017-04-07 | Disposition: A | Discharge status: Home / Self Care | Payer: Medicaid Other | Source: Ambulatory Visit | Attending: Podiatry | Admitting: Podiatry

## 2017-04-07 ENCOUNTER — Ambulatory Visit: Admission: RE | Admit: 2017-04-07 | Payer: Medicaid Other | Source: Ambulatory Visit

## 2017-04-07 DIAGNOSIS — M7752 Other enthesopathy of left foot: Secondary | ICD-10-CM | POA: Insufficient documentation

## 2017-04-07 DIAGNOSIS — Z8781 Personal history of (healed) traumatic fracture: Secondary | ICD-10-CM | POA: Insufficient documentation

## 2017-04-07 DIAGNOSIS — M659 Synovitis and tenosynovitis, unspecified: Secondary | ICD-10-CM | POA: Insufficient documentation

## 2017-04-07 DIAGNOSIS — R937 Abnormal findings on diagnostic imaging of other parts of musculoskeletal system: Secondary | ICD-10-CM | POA: Insufficient documentation

## 2017-04-11 ENCOUNTER — Ambulatory Visit (INDEPENDENT_AMBULATORY_CARE_PROVIDER_SITE_OTHER): Payer: Medicaid Other | Admitting: Podiatry

## 2017-04-11 ENCOUNTER — Encounter: Payer: Self-pay | Admitting: Podiatry

## 2017-04-11 DIAGNOSIS — M19072 Primary osteoarthritis, left ankle and foot: Secondary | ICD-10-CM
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
## 2017-05-09 ENCOUNTER — Ambulatory Visit (INDEPENDENT_AMBULATORY_CARE_PROVIDER_SITE_OTHER): Payer: Medicaid Other | Admitting: Podiatry

## 2017-05-09 ENCOUNTER — Ambulatory Visit (INDEPENDENT_AMBULATORY_CARE_PROVIDER_SITE_OTHER): Payer: Medicaid Other

## 2017-05-09 DIAGNOSIS — M19072 Primary osteoarthritis, left ankle and foot: Secondary | ICD-10-CM | POA: Diagnosis not present

## 2017-05-09 DIAGNOSIS — S99922A Unspecified injury of left foot, initial encounter: Secondary | ICD-10-CM

## 2017-05-09 NOTE — Patient Instructions (Signed)
Pre-Operative Instructions  Congratulations, you have decided to take an important step towards improving your quality of life.  You can be assured that the doctors and staff at [facility] will be with you every step of the way.  Here are some important things you should know:  1. Plan to be at the surgery center/hospital at least 1 (one) hour prior to your scheduled time, unless otherwise directed by the surgical center/hospital staff.  You must have a responsible adult accompany you, remain during the surgery and drive you home.  Make sure you have directions to the surgical center/hospital to ensure you arrive on time. 2. If you are having surgery at [facility] or [facility] hospitals, you will need a copy of your medical history and physical form from your family physician within one month prior to the date of surgery. We will give you a form for your primary physician to complete.  3. We make every effort to accommodate the date you request for surgery.  However, there are times where surgery dates or times have to be moved.  We will contact you as soon as possible if a change in schedule is required.   4. No aspirin/ibuprofen for one week before surgery.  If you are on aspirin, any non-steroidal anti-inflammatory medications (Mobic, Aleve, Ibuprofen) should not be taken seven (7) days prior to your surgery.  You make take Tylenol for pain prior to surgery.  5. Medications - If you are taking daily heart and blood pressure medications, seizure, reflux, allergy, asthma, anxiety, pain or diabetes medications, make sure you notify the surgery center/hospital before the day of surgery so they can tell you which medications you should take or avoid the day of surgery. 6. No food or drink after midnight the night before surgery unless directed otherwise by surgical center/hospital staff. 7. No alcoholic beverages 24-hours prior to surgery.  No smoking 24-hours prior or 24-hours after  surgery. 8. Wear loose pants or shorts. They should be loose enough to fit over bandages, boots, and casts. 9. Don't wear slip-on shoes. Sneakers are preferred. 10. Bring your boot with you to the surgery center/hospital.  Also bring crutches or a walker if your physician has prescribed it for you.  If you do not have this equipment, it will be provided for you after surgery. 11. If you have not been contacted by the surgery center/hospital by the day before your surgery, call to confirm the date and time of your surgery. 12. Leave-time from work may vary depending on the type of surgery you have.  Appropriate arrangements should be made prior to surgery with your employer. 13. Prescriptions will be provided immediately following surgery by your doctor.  Fill these as soon as possible after surgery and take the medication as directed. Pain medications will not be refilled on weekends and must be approved by the doctor. 14. Remove nail polish on the operative foot and avoid getting pedicures prior to surgery. 15. Wash the night before surgery.  The night before surgery wash the foot and leg well with water and the antibacterial soap provided. Be sure to pay special attention to beneath the toenails and in between the toes.  Wash for at least three (3) minutes. Rinse thoroughly with water and dry well with a towel.  Perform this wash unless told not to do so by your physician.  Enclosed: 1 Ice pack (please put in freezer the night before surgery)   1 Hibiclens skin cleaner     Pre-op instructions  If you have any questions regarding the instructions, please do not hesitate to call our office.  [place]: [address], [place], [place] [address] -- [phone]  [place]: [address] [place], [place] [address] -- [phone]  [place]: [address]  [place], [place] [address] -- [phone]  [place]: [address], [place], [place] [address] -- [phone]  Website:  [email or web address] 
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
## 2018-04-20 ENCOUNTER — Other Ambulatory Visit (HOSPITAL_COMMUNITY): Payer: Self-pay | Admitting: Family Medicine

## 2018-04-20 DIAGNOSIS — Z369 Encounter for antenatal screening, unspecified: Secondary | ICD-10-CM

## 2018-04-20 DIAGNOSIS — Z91410 Personal history of adult physical and sexual abuse: Secondary | ICD-10-CM | POA: Insufficient documentation

## 2018-04-20 LAB — OB RESULTS CONSOLE GC/CHLAMYDIA
Chlamydia: NEGATIVE
Gonorrhea: NEGATIVE

## 2018-04-20 LAB — HM PAP SMEAR: HM Pap smear: NEGATIVE

## 2018-04-20 LAB — OB RESULTS CONSOLE RPR: RPR: NONREACTIVE

## 2018-04-20 LAB — OB RESULTS CONSOLE HEPATITIS B SURFACE ANTIGEN: Hepatitis B Surface Ag: NEGATIVE

## 2018-04-20 LAB — OB RESULTS CONSOLE HIV ANTIBODY (ROUTINE TESTING): HIV: NONREACTIVE

## 2018-05-11 ENCOUNTER — Ambulatory Visit
Admission: RE | Admit: 2018-05-11 | Discharge: 2018-05-11 | Disposition: A | Discharge status: Home / Self Care | Payer: Medicaid Other | Source: Ambulatory Visit | Attending: Maternal & Fetal Medicine | Admitting: Maternal & Fetal Medicine

## 2018-05-11 ENCOUNTER — Ambulatory Visit
Admission: RE | Admit: 2018-05-11 | Discharge: 2018-05-11 | Disposition: A | Discharge status: Home / Self Care | Payer: Medicaid Other | Source: Ambulatory Visit | Attending: Family Medicine | Admitting: Family Medicine

## 2018-05-11 DIAGNOSIS — Z369 Encounter for antenatal screening, unspecified: Secondary | ICD-10-CM

## 2018-05-11 NOTE — Progress Notes (Signed)
Ms. Tiffany Sherman was scheduled for genetic counseling and first trimester screening today at [facility].  She reported that she had an ultrasound performed at [facility] for viability and dating on 04/27/2018 which changed her due date to 11/07/2018.  Therefore, she is currently 14 weeks and 2 days and is too far along for first trimester screening.  Given the gestational age, we reviewed the option of maternal serum quad screening for Down syndrome, trisomy 18 and open neural tube defects after 15 weeks, which can be performed at [facility].  She reported no family history or pregnancy history concerns.  Prior hemoglobinopathy screening was done at [facility] and was normal.  She was offered and declined carrier screening for CF and SMA today.  The patient was scheduled to return to our clinic for an anatomy ultrasound at [redacted] weeks gestation.  No ultrasound or formal genetic counseling was done today.  We may be reached at [phone] with any questions or concerns.  Cherly Andersoneborah F. Aleece Loyd, MS, CGC

## 2018-06-04 ENCOUNTER — Other Ambulatory Visit: Payer: Self-pay

## 2018-06-04 DIAGNOSIS — Z1389 Encounter for screening for other disorder: Secondary | ICD-10-CM

## 2018-06-08 ENCOUNTER — Inpatient Hospital Stay: Admission: RE | Admit: 2018-06-08 | Payer: Medicaid Other | Source: Ambulatory Visit

## 2018-06-15 ENCOUNTER — Ambulatory Visit
Admission: RE | Admit: 2018-06-15 | Discharge: 2018-06-15 | Disposition: A | Discharge status: Home / Self Care | Payer: Medicaid Other | Source: Ambulatory Visit | Attending: Obstetrics & Gynecology | Admitting: Obstetrics & Gynecology

## 2018-06-15 DIAGNOSIS — Z363 Encounter for antenatal screening for malformations: Secondary | ICD-10-CM | POA: Diagnosis not present

## 2018-06-15 DIAGNOSIS — Z1389 Encounter for screening for other disorder: Secondary | ICD-10-CM

## 2018-08-13 ENCOUNTER — Other Ambulatory Visit: Payer: Self-pay

## 2018-08-13 DIAGNOSIS — O4442 Low lying placenta NOS or without hemorrhage, second trimester: Secondary | ICD-10-CM

## 2018-08-17 ENCOUNTER — Ambulatory Visit
Admission: RE | Admit: 2018-08-17 | Discharge: 2018-08-17 | Disposition: A | Discharge status: Home / Self Care | Payer: Medicaid Other | Source: Ambulatory Visit | Attending: Maternal & Fetal Medicine | Admitting: Maternal & Fetal Medicine

## 2018-08-17 DIAGNOSIS — O4442 Low lying placenta NOS or without hemorrhage, second trimester: Secondary | ICD-10-CM | POA: Insufficient documentation

## 2018-08-17 DIAGNOSIS — Z3A28 28 weeks gestation of pregnancy: Secondary | ICD-10-CM | POA: Diagnosis not present

## 2018-08-17 HISTORY — DX: Headache, unspecified: R51.9

## 2018-08-17 HISTORY — DX: Headache: R51

## 2018-08-22 ENCOUNTER — Emergency Department
Admission: EM | Admit: 2018-08-22 | Discharge: 2018-08-22 | Disposition: A | Discharge status: Home / Self Care | Payer: Medicaid Other | Attending: Emergency Medicine | Admitting: Emergency Medicine

## 2018-08-22 ENCOUNTER — Other Ambulatory Visit: Payer: Self-pay

## 2018-08-22 ENCOUNTER — Encounter: Payer: Self-pay | Admitting: Emergency Medicine

## 2018-08-22 DIAGNOSIS — R079 Chest pain, unspecified: Secondary | ICD-10-CM | POA: Diagnosis present

## 2018-08-22 DIAGNOSIS — F17210 Nicotine dependence, cigarettes, uncomplicated: Secondary | ICD-10-CM | POA: Diagnosis not present

## 2018-08-22 DIAGNOSIS — J069 Acute upper respiratory infection, unspecified: Secondary | ICD-10-CM | POA: Diagnosis not present

## 2018-08-22 DIAGNOSIS — B9789 Other viral agents as the cause of diseases classified elsewhere: Secondary | ICD-10-CM

## 2018-08-22 DIAGNOSIS — R05 Cough: Secondary | ICD-10-CM | POA: Insufficient documentation

## 2018-08-22 LAB — GROUP A STREP BY PCR: GROUP A STREP BY PCR: NOT DETECTED

## 2018-08-22 MED ORDER — CETIRIZINE HCL 5 MG PO TABS: 5.0000 mg | ORAL_TABLET | Freq: Every day | ORAL | 0 refills | Status: DC

## 2018-08-22 MED ORDER — FLUTICASONE PROPIONATE 50 MCG/ACT NA SUSP: 2.0000 | Freq: Every day | NASAL | 0 refills | Status: DC

## 2018-08-22 NOTE — ED Notes (Signed)
See triage note  Presents with runny nose cough and congestion for about 4 days  States cough has been prod ..yellowish phlegm   No fever

## 2018-08-22 NOTE — ED Provider Notes (Signed)
[facility] Emergency Department Provider Note ____________________________________________  Time seen: 1435  I have reviewed the triage vital signs and the nursing notes.  HISTORY  Chief Complaint  Sore Throat  HPI Tiffany Sherman is a 22 y.o. female who presents herself to the ED for a 4-day complaint of throat, runny nose, cough, congestion.  She also reports productive cough with some yellowish phlegm.  Patient denies any interim fevers.  She is [redacted] weeks pregnant denies any pregnancy related complaints at this time.  Past Medical History:  Diagnosis Date  . Headache     Patient Active Problem List   Diagnosis Date Noted  . Gastroesophageal reflux 04/04/2013  . Pregnancy 04/04/2013    Past Surgical History:  Procedure Laterality Date  . VAGINAL WOUND CLOSURE / REPAIR     Per pt 80 stitiches    Prior to Admission medications   Medication Sig Start Date End Date Taking? Authorizing Provider  cetirizine (ZYRTEC) 5 MG tablet Take 1 tablet (5 mg total) by mouth daily. 08/22/18   Eileene Kisling, Charlesetta Ivory, PA-C  ferrous sulfate 325 (65 FE) MG EC tablet Take 325 mg by mouth 3 (three) times daily with meals.    [provider]  fluticasone (FLONASE) 50 MCG/ACT nasal spray Place 2 sprays into both nostrils daily. 08/22/18   Geneive Sandstrom, Charlesetta Ivory, PA-C  Prenatal Vit-Fe Fumarate-FA (MULTIVITAMIN-PRENATAL) 27-0.8 MG TABS tablet Take 1 tablet by mouth daily at 12 noon.    [provider]    Allergies Bee venom; Calamine; and Latex  Family History  Problem Relation Age of Onset  . Heart disease Mother   . Cancer Father   . Heart disease Father   . Kidney disease Father   . Liver disease Father     Social History Social History   Tobacco Use  . Smoking status: Current Every Day Smoker    Packs/day: 0.50    Types: Cigarettes  . Smokeless tobacco: Never Used  Substance Use Topics  . Alcohol use: No  . Drug use: No    Review of  Systems  Constitutional: Negative for fever. Eyes: Negative for visual changes. ENT: Positive for sore throat, runny nose, and congestion. Cardiovascular: Negative for chest pain. Respiratory: Negative for shortness of breath. Reports productive cough Gastrointestinal: Negative for abdominal pain, vomiting and diarrhea. Genitourinary: Negative for dysuria. Musculoskeletal: Negative for back pain. Skin: Negative for rash. Neurological: Negative for headaches, focal weakness or numbness. ____________________________________________  PHYSICAL EXAM:  VITAL SIGNS: ED Triage Vitals  Enc Vitals Group     BP 08/22/18 1408 110/63     Pulse Rate 08/22/18 1408 78     Resp 08/22/18 1408 20     Temp 08/22/18 1408 98.1 F (36.7 C)     Temp Source 08/22/18 1408 Oral     SpO2 08/22/18 1408 98 %     Weight 08/22/18 1409 170 lb (77.1 kg)     Height 08/22/18 1409 5\' 8"  (1.727 m)     Head Circumference --      Peak Flow --      Pain Score 08/22/18 1409 9     Pain Loc --      Pain Edu? --      Excl. in GC? --     Constitutional: Alert and oriented. Well appearing and in no distress. Head: Normocephalic and atraumatic. Eyes: Conjunctivae are normal. PERRL. Normal extraocular movements Ears: Canals clear. TMs intact bilaterally. Nose: No congestion/rhinorrhea/epistaxis. Mouth/Throat: Mucous  membranes are moist.  Uvula is midline and tonsils are flat.  No oropharyngeal lesions are appreciated. Neck: Supple. No thyromegaly. Hematological/Lymphatic/Immunological: No cervical lymphadenopathy. Cardiovascular: Normal rate, regular rhythm. Normal distal pulses. Respiratory: Normal respiratory effort. No wheezes/rales/rhonchi. Gastrointestinal: Gravid, soft and nontender. No distention.  No CVA tenderness appreciated. ____________________________________________   LABS (pertinent positives/negatives)  Labs Reviewed  GROUP A STREP BY PCR   ____________________________________________  PROCEDURES  Procedures ____________________________________________  INITIAL IMPRESSION / ASSESSMENT AND PLAN / ED COURSE  Patient with ED evaluation of 2 to 3-day complaint of runny nose, cough, congestion.  She is also noted an intermittently productive cough.  She denies any interim fevers.  She is been taken Tylenol as is appropriate in her current pregnancy state.  Her rapid strep test is negative at this time.  Patient symptoms likely represent a viral etiology.  She will be discharged with instructions to take over-the-counter Delsym for cough relief.  She is also advised that Benadryl, cetirizine, and Flonase are considered safe in pregnancy.  Prescriptions for Flonase and cetirizine are provided at this time.  Patient will follow with primary provider return to the ED as needed.  Work is provided for 2 days as requested. ____________________________________________  FINAL CLINICAL IMPRESSION(S) / ED DIAGNOSES  Final diagnoses:  Viral URI with cough      Lauriana Denes V BaconCharlesetta Ivory, PA-C 08/22/18 1534    Sharyn CreamerQuale, Mark, MD 08/22/18 2305

## 2018-08-22 NOTE — ED Triage Notes (Signed)
Sore throat x 4 days. [redacted] weeks pregnant with no contractions, fluid leak or vag bleed.

## 2018-08-22 NOTE — Discharge Instructions (Addendum)
Your rapid strep test is negative. Your symptoms are likely due to a viral URI. Take OTC Delsym (dextromethorphan) for cough relief. Take the daily allergy medicine as needed. Follow-up with your provider as needed.

## 2018-09-23 NOTE — L&D Delivery Note (Signed)
Delivery Note  Date of delivery: 11/12/2018 Estimated Date of Delivery: 11/07/18 No LMP recorded (lmp unknown). Patient is pregnant. EGA: [redacted]w[redacted]d  First Stage: Induction/Augmentation : misoprostol, pitocin, AROM Analgesia Tiffany Sherman intrapartum: epidural AROM at 1547  Tiffany Sherman presented to L&D for elective induction at term. She was induced with misoprostol and augmented with pitocin and AROM. Epidural placed.   Second Stage: Complete dilation at 1620 Onset of pushing at 1630 FHR second stage: category II with deep prolonged decelerations to the 70s Delivery at 1655 on 11/12/2018  She progressed to complete and had a spontaneous vaginal birth of a live female over a midline episiotomy performed under epidural anesthesia. The fetal head was delivered in direct OA position with restitution to LOA with right nuchal hand. No nuchal cord. Anterior then posterior shoulders delivered spontaneously. Baby placed on mom's abdomen and attended to by transition RN and NNP. Cord clamped and cut when pulseless by grandmother of the baby.  Third Stage: Placenta delivered spointaneously intact with 3VC at 1659 Placenta disposition: routine disposal Uterine tone firm / bleeding brisk then slowed IV pitocin and misoprostol PR given for hemorrhage prophylaxis  2nd laceration identified at site of episiotomy, along with b/l labial lacerations Anesthesia for repair: epidural Repair 3-0 Vicryl Rapide CT, 3-0 Vicryl SH Est. Blood Loss (mL): 750  Complications: none  Mom to postpartum.  Baby to Couplet care / Skin to Skin.  Newborn: Birth Weight: pending  Apgar Scores: 8 9 Feeding planned: breast while in hospital, formula going home   Tiffany Sherman, [place] 11/12/2018 5:30 PM

## 2018-10-26 ENCOUNTER — Observation Stay
Admission: EM | Admit: 2018-10-26 | Discharge: 2018-10-26 | Disposition: A | Discharge status: Home / Self Care | Payer: Medicaid Other | Attending: Obstetrics and Gynecology | Admitting: Obstetrics and Gynecology

## 2018-10-26 ENCOUNTER — Other Ambulatory Visit: Payer: Self-pay

## 2018-10-26 DIAGNOSIS — O368190 Decreased fetal movements, unspecified trimester, not applicable or unspecified: Secondary | ICD-10-CM | POA: Diagnosis present

## 2018-10-26 DIAGNOSIS — O99333 Smoking (tobacco) complicating pregnancy, third trimester: Secondary | ICD-10-CM | POA: Insufficient documentation

## 2018-10-26 DIAGNOSIS — O368130 Decreased fetal movements, third trimester, not applicable or unspecified: Principal | ICD-10-CM | POA: Insufficient documentation

## 2018-10-26 DIAGNOSIS — O98313 Other infections with a predominantly sexual mode of transmission complicating pregnancy, third trimester: Secondary | ICD-10-CM | POA: Diagnosis not present

## 2018-10-26 DIAGNOSIS — A5901 Trichomonal vulvovaginitis: Secondary | ICD-10-CM | POA: Diagnosis not present

## 2018-10-26 DIAGNOSIS — O99013 Anemia complicating pregnancy, third trimester: Secondary | ICD-10-CM | POA: Diagnosis not present

## 2018-10-26 DIAGNOSIS — D649 Anemia, unspecified: Secondary | ICD-10-CM | POA: Insufficient documentation

## 2018-10-26 DIAGNOSIS — Z3A38 38 weeks gestation of pregnancy: Secondary | ICD-10-CM | POA: Diagnosis not present

## 2018-10-26 DIAGNOSIS — F17210 Nicotine dependence, cigarettes, uncomplicated: Secondary | ICD-10-CM | POA: Diagnosis not present

## 2018-10-26 HISTORY — DX: Unspecified fracture of unspecified foot, initial encounter for closed fracture: S92.909A

## 2018-10-26 LAB — URINALYSIS, ROUTINE W REFLEX MICROSCOPIC
Bilirubin Urine: NEGATIVE
Glucose, UA: NEGATIVE mg/dL
Hgb urine dipstick: NEGATIVE
Ketones, ur: NEGATIVE mg/dL
Nitrite: NEGATIVE
Protein, ur: NEGATIVE mg/dL
SPECIFIC GRAVITY, URINE: 1.012 (ref 1.005–1.030)
pH: 7 (ref 5.0–8.0)

## 2018-10-26 LAB — WET PREP, GENITAL
SPERM: NONE SEEN
Yeast Wet Prep HPF POC: NONE SEEN

## 2018-10-26 LAB — CHLAMYDIA/NGC RT PCR (ARMC ONLY)
Chlamydia Tr: NOT DETECTED
N gonorrhoeae: NOT DETECTED

## 2018-10-26 MED ORDER — CEFTRIAXONE SODIUM 250 MG IJ SOLR
250.0000 mg | Freq: Once | INTRAMUSCULAR | Status: DC
  Filled 2018-10-26: qty 250

## 2018-10-26 MED ORDER — METRONIDAZOLE 500 MG PO TABS
500.0000 mg | ORAL_TABLET | Freq: Two times a day (BID) | ORAL | Status: DC
  Administered 2018-10-26: 500 mg via ORAL
  Filled 2018-10-26: qty 1

## 2018-10-26 MED ORDER — AZITHROMYCIN 500 MG PO TABS
1000.0000 mg | ORAL_TABLET | Freq: Once | ORAL | Status: DC
  Filled 2018-10-26: qty 2

## 2018-10-26 MED ORDER — METRONIDAZOLE 500 MG PO TABS: 500.0000 mg | ORAL_TABLET | Freq: Two times a day (BID) | ORAL | 0 refills | Status: AC

## 2018-10-26 NOTE — Discharge Instructions (Signed)
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
## 2018-10-26 NOTE — OB Triage Note (Signed)
Pt. Presented to triage with reported decreased fetal movement. She reports feeling baby move last night. Baby seen moving upon arrival. FHT 141. No bleeding or urinary issues. The patient does report a thick, greenish/yellow discharge that has been ongoing for a few weeks. She also has an area of irritated skin on her left thigh. Vitals stable. Will continue to monitor.

## 2018-10-27 LAB — URINE CULTURE: Culture: <10000 — AB

## 2018-11-05 ENCOUNTER — Telehealth: Payer: Self-pay

## 2018-11-05 NOTE — Telephone Encounter (Signed)
Pt aware of neg u/c.  Pt requested gc/ch and wet prep results. Informed pt that [facility] midwife and MD ordered lab results. She will need to contact them.  They also signed discharge summary. Pt reached out to [facility] but they did not give her results.  Called [facility]- Spoke to [place]. Informed her to pls have provider reach out to pt with results.Pt pos for trich and bv. Flagyl given at d/c. Pt is currently taken meds.  She will send a message back to on call Dr to respond to the pt.   Pts CB number given. Pt aware. She will call me back if no return call by Monday.  Pt appreciative of the call.

## 2018-11-09 ENCOUNTER — Other Ambulatory Visit: Payer: Self-pay | Admitting: Obstetrics and Gynecology

## 2018-11-09 NOTE — Progress Notes (Signed)
IOL orders for admission on 11/12/18

## 2018-11-11 ENCOUNTER — Encounter: Payer: Self-pay | Admitting: Certified Nurse Midwife

## 2018-11-11 ENCOUNTER — Other Ambulatory Visit: Payer: Self-pay | Admitting: Certified Nurse Midwife

## 2018-11-11 NOTE — Progress Notes (Signed)
  Tiffany Sherman is a 23 y.o. [id number] female at [redacted]w[redacted]d dated by [redacted]w[redacted]d ultrasound on 04/27/2018.    Pregnancy Issues: 1. Anemia on iron supplementation 2. Initial prenatal visit at [facility] at 11 weeks, reentered care 26 weeks at [facility] fractured care 3. Tobacco use in pregnancy, currently smoking ~3 cigarettes/day 4. Urine drug screen positive for THC at new OB visit, most recheck screen negative per records 5. 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 History of trich during pregnancy, treated, resolved as of 11/09/2018 7. Low-lying placenta, resolved on [redacted]w[redacted]d u/s on 08/17/18 8. Hx 2nd degree with bilateral labial lacerations with repair in the OR with first delivery  Prenatal Labs: Blood type/Rh A+  Antibody screen neg  Rubella immune  Varicella immune  RPR NR  HBsAg Neg  HIV NR  GC neg  Chlamydia neg  Genetic screening Not completed  1 hour GTT 85  3 hour GTT n/a  GBS negative    Genia Del, CNM 11/11/2018 11:47 AM

## 2018-11-12 ENCOUNTER — Inpatient Hospital Stay: Payer: Medicaid Other | Admitting: Anesthesiology

## 2018-11-12 ENCOUNTER — Inpatient Hospital Stay
Admission: EM | Admit: 2018-11-12 | Discharge: 2018-11-14 | DRG: 806 | Disposition: A | Discharge status: Home / Self Care | Payer: Medicaid Other | Attending: Certified Nurse Midwife | Admitting: Certified Nurse Midwife

## 2018-11-12 ENCOUNTER — Other Ambulatory Visit: Payer: Self-pay

## 2018-11-12 DIAGNOSIS — O9832 Other infections with a predominantly sexual mode of transmission complicating childbirth: Secondary | ICD-10-CM | POA: Diagnosis present

## 2018-11-12 DIAGNOSIS — Z3A40 40 weeks gestation of pregnancy: Secondary | ICD-10-CM | POA: Diagnosis not present

## 2018-11-12 DIAGNOSIS — A6000 Herpesviral infection of urogenital system, unspecified: Secondary | ICD-10-CM | POA: Diagnosis present

## 2018-11-12 DIAGNOSIS — O99334 Smoking (tobacco) complicating childbirth: Secondary | ICD-10-CM | POA: Diagnosis present

## 2018-11-12 DIAGNOSIS — F17210 Nicotine dependence, cigarettes, uncomplicated: Secondary | ICD-10-CM | POA: Diagnosis present

## 2018-11-12 DIAGNOSIS — D649 Anemia, unspecified: Secondary | ICD-10-CM | POA: Diagnosis present

## 2018-11-12 DIAGNOSIS — O9902 Anemia complicating childbirth: Secondary | ICD-10-CM | POA: Diagnosis present

## 2018-11-12 DIAGNOSIS — O480 Post-term pregnancy: Secondary | ICD-10-CM | POA: Diagnosis present

## 2018-11-12 LAB — URINE DRUG SCREEN, QUALITATIVE (ARMC ONLY)
Amphetamines, Ur Screen: NOT DETECTED
Barbiturates, Ur Screen: NOT DETECTED
Benzodiazepine, Ur Scrn: NOT DETECTED
Cannabinoid 50 Ng, Ur ~~LOC~~: NOT DETECTED
Cocaine Metabolite,Ur ~~LOC~~: NOT DETECTED
MDMA (Ecstasy)Ur Screen: NOT DETECTED
METHADONE SCREEN, URINE: NOT DETECTED
Opiate, Ur Screen: NOT DETECTED
Phencyclidine (PCP) Ur S: NOT DETECTED
Tricyclic, Ur Screen: NOT DETECTED

## 2018-11-12 LAB — CBC
HCT: 33.5 % — ABNORMAL LOW (ref 36.0–46.0)
Hemoglobin: 11 g/dL — ABNORMAL LOW (ref 12.0–15.0)
MCH: 30.1 pg (ref 26.0–34.0)
MCHC: 32.8 g/dL (ref 30.0–36.0)
MCV: 91.5 fL (ref 80.0–100.0)
NRBC: 0 % (ref 0.0–0.2)
Platelets: 199 10*3/uL (ref 150–400)
RBC: 3.66 MIL/uL — ABNORMAL LOW (ref 3.87–5.11)
RDW: 13.8 % (ref 11.5–15.5)
WBC: 9.4 10*3/uL (ref 4.0–10.5)

## 2018-11-12 LAB — TYPE AND SCREEN
ABO/RH(D): A POS
Antibody Screen: NEGATIVE

## 2018-11-12 MED ORDER — FERROUS SULFATE 325 (65 FE) MG PO TABS
325.0000 mg | ORAL_TABLET | Freq: Two times a day (BID) | ORAL | Status: DC
  Administered 2018-11-13 – 2018-11-14 (×3): 325 mg via ORAL
  Filled 2018-11-12 (×3): qty 1

## 2018-11-12 MED ORDER — LACTATED RINGERS IV SOLN
500.0000 mL | INTRAVENOUS | Status: DC | PRN
  Administered 2018-11-12: 500 mL via INTRAVENOUS

## 2018-11-12 MED ORDER — TERBUTALINE SULFATE 1 MG/ML IJ SOLN: 0.2500 mg | Freq: Once | INTRAMUSCULAR | Status: DC | PRN

## 2018-11-12 MED ORDER — FENTANYL 2.5 MCG/ML W/ROPIVACAINE 0.15% IN NS 100 ML EPIDURAL (ARMC)
EPIDURAL | Status: AC
  Filled 2018-11-12: qty 100

## 2018-11-12 MED ORDER — MISOPROSTOL 200 MCG PO TABS: 800.0000 ug | ORAL_TABLET | Freq: Once | ORAL | Status: DC

## 2018-11-12 MED ORDER — ONDANSETRON HCL 4 MG/2ML IJ SOLN: 4.0000 mg | INTRAMUSCULAR | Status: DC | PRN

## 2018-11-12 MED ORDER — MISOPROSTOL 25 MCG QUARTER TABLET
25.0000 ug | ORAL_TABLET | Freq: Once | ORAL | Status: AC
  Administered 2018-11-12: 25 ug via BUCCAL
  Filled 2018-11-12: qty 1

## 2018-11-12 MED ORDER — OXYTOCIN 40 UNITS IN NORMAL SALINE INFUSION - SIMPLE MED: 2.5000 [IU]/h | INTRAVENOUS | Status: DC

## 2018-11-12 MED ORDER — PRENATAL MULTIVITAMIN CH
1.0000 | ORAL_TABLET | Freq: Every day | ORAL | Status: DC
  Administered 2018-11-13 – 2018-11-14 (×2): 1 via ORAL
  Filled 2018-11-12 (×2): qty 1

## 2018-11-12 MED ORDER — MISOPROSTOL 25 MCG QUARTER TABLET
25.0000 ug | ORAL_TABLET | ORAL | Status: DC | PRN
  Administered 2018-11-12: 25 ug via VAGINAL
  Filled 2018-11-12: qty 1

## 2018-11-12 MED ORDER — BENZOCAINE-MENTHOL 20-0.5 % EX AERO
1.0000 "application " | INHALATION_SPRAY | CUTANEOUS | Status: DC | PRN
  Administered 2018-11-13: 1 via TOPICAL
  Filled 2018-11-12 (×2): qty 56

## 2018-11-12 MED ORDER — LIDOCAINE-EPINEPHRINE (PF) 1.5 %-1:200000 IJ SOLN
INTRAMUSCULAR | Status: DC | PRN
  Administered 2018-11-12: 3 mL via EPIDURAL

## 2018-11-12 MED ORDER — LIDOCAINE HCL (PF) 1 % IJ SOLN
INTRAMUSCULAR | Status: DC | PRN
  Administered 2018-11-12: 3 mL via SUBCUTANEOUS

## 2018-11-12 MED ORDER — LIDOCAINE HCL (PF) 1 % IJ SOLN: 30.0000 mL | INTRAMUSCULAR | Status: DC | PRN

## 2018-11-12 MED ORDER — SIMETHICONE 80 MG PO CHEW: 160.0000 mg | CHEWABLE_TABLET | ORAL | Status: DC | PRN

## 2018-11-12 MED ORDER — ONDANSETRON HCL 4 MG/2ML IJ SOLN: 4.0000 mg | Freq: Four times a day (QID) | INTRAMUSCULAR | Status: DC | PRN

## 2018-11-12 MED ORDER — DIBUCAINE 1 % RE OINT: 1.0000 "application " | TOPICAL_OINTMENT | RECTAL | Status: DC | PRN

## 2018-11-12 MED ORDER — IBUPROFEN 600 MG PO TABS
600.0000 mg | ORAL_TABLET | Freq: Four times a day (QID) | ORAL | Status: DC
  Administered 2018-11-12 – 2018-11-14 (×7): 600 mg via ORAL
  Filled 2018-11-12 (×8): qty 1

## 2018-11-12 MED ORDER — DIPHENHYDRAMINE HCL 25 MG PO CAPS: 25.0000 mg | ORAL_CAPSULE | Freq: Four times a day (QID) | ORAL | Status: DC | PRN

## 2018-11-12 MED ORDER — COCONUT OIL OIL: 1.0000 "application " | TOPICAL_OIL | Status: DC | PRN

## 2018-11-12 MED ORDER — FENTANYL 2.5 MCG/ML W/ROPIVACAINE 0.15% IN NS 100 ML EPIDURAL (ARMC)
EPIDURAL | Status: DC | PRN
  Administered 2018-11-12: 12 mL/h via EPIDURAL

## 2018-11-12 MED ORDER — OXYTOCIN 10 UNIT/ML IJ SOLN
INTRAMUSCULAR | Status: AC
  Filled 2018-11-12: qty 2

## 2018-11-12 MED ORDER — LACTATED RINGERS IV SOLN
INTRAVENOUS | Status: DC
  Administered 2018-11-12 (×2): via INTRAVENOUS

## 2018-11-12 MED ORDER — LIDOCAINE HCL (PF) 1 % IJ SOLN
INTRAMUSCULAR | Status: AC
  Filled 2018-11-12: qty 30

## 2018-11-12 MED ORDER — AMMONIA AROMATIC IN INHA
RESPIRATORY_TRACT | Status: AC
  Filled 2018-11-12: qty 10

## 2018-11-12 MED ORDER — ONDANSETRON HCL 4 MG PO TABS: 4.0000 mg | ORAL_TABLET | ORAL | Status: DC | PRN

## 2018-11-12 MED ORDER — OXYTOCIN 40 UNITS IN NORMAL SALINE INFUSION - SIMPLE MED
1.0000 m[IU]/min | INTRAVENOUS | Status: DC
  Administered 2018-11-12: 1 m[IU]/min via INTRAVENOUS
  Filled 2018-11-12: qty 1000

## 2018-11-12 MED ORDER — MISOPROSTOL 200 MCG PO TABS
ORAL_TABLET | ORAL | Status: AC
  Administered 2018-11-12: 17:00:00
  Filled 2018-11-12: qty 4

## 2018-11-12 MED ORDER — OXYTOCIN BOLUS FROM INFUSION
500.0000 mL | Freq: Once | INTRAVENOUS | Status: AC
  Administered 2018-11-12: 500 mL via INTRAVENOUS

## 2018-11-12 MED ORDER — SOD CITRATE-CITRIC ACID 500-334 MG/5ML PO SOLN: 30.0000 mL | ORAL | Status: DC | PRN

## 2018-11-12 MED ORDER — ACETAMINOPHEN 325 MG PO TABS
650.0000 mg | ORAL_TABLET | ORAL | Status: DC | PRN
  Administered 2018-11-12 – 2018-11-14 (×7): 650 mg via ORAL
  Filled 2018-11-12 (×7): qty 2

## 2018-11-12 MED ORDER — BUPIVACAINE HCL (PF) 0.25 % IJ SOLN
INTRAMUSCULAR | Status: DC | PRN
  Administered 2018-11-12: 3 mL via EPIDURAL
  Administered 2018-11-12: 4 mL via EPIDURAL

## 2018-11-12 MED ORDER — SENNOSIDES-DOCUSATE SODIUM 8.6-50 MG PO TABS
2.0000 | ORAL_TABLET | ORAL | Status: DC
  Administered 2018-11-13: 2 via ORAL
  Filled 2018-11-12: qty 2

## 2018-11-12 MED ORDER — WITCH HAZEL-GLYCERIN EX PADS: 1.0000 "application " | MEDICATED_PAD | CUTANEOUS | Status: DC | PRN

## 2018-11-12 MED ORDER — ACETAMINOPHEN 325 MG PO TABS: 650.0000 mg | ORAL_TABLET | ORAL | Status: DC | PRN

## 2018-11-12 NOTE — Anesthesia Procedure Notes (Signed)
Epidural  Start time: 11/12/2018 2:13 PM End time: 11/12/2018 2:28 PM  Staffing Anesthesiologist: Naomie Dean, MD Resident/CRNA: Irving Burton, CRNA Performed: resident/CRNA   Preanesthetic Checklist Completed: patient identified, site marked, surgical consent, pre-op evaluation, IV checked, risks and benefits discussed and monitors and equipment checked  Epidural Patient position: sitting Prep: ChloraPrep and site prepped and draped Patient monitoring: heart rate, continuous pulse ox and blood pressure Approach: midline Location: L3-L4 Injection technique: LOR air  Needle:  Needle type: Tuohy  Needle gauge: 17 G Needle length: 9 cm Needle insertion depth: 7 cm Catheter type: closed end flexible Catheter size: 19 Gauge Catheter at skin depth: 12 cm Test dose: negative and 1.5% lidocaine with Epi 1:200 K  Assessment Events: blood not aspirated, injection not painful, no injection resistance, negative IV test and no paresthesia  Additional Notes Reason for block:procedure for pain

## 2018-11-12 NOTE — Progress Notes (Signed)
Labor Progress Note  Tiffany Sherman is a 23 y.o. [id number] at [redacted]w[redacted]d by [redacted]w[redacted]d ultrasound admitted for elective induction of labor at term.   Subjective:  More comfortable with epidural in place.   Objective: BP 112/75 (BP Location: Left Arm)   Pulse 63   Temp 98.3 F (36.8 C) (Oral)   Resp 16   Ht 5\' 9"  (1.753 m)   Wt 80.7 kg   LMP  (LMP Unknown)   SpO2 99%   BMI 26.29 kg/m   Fetal Assessment: FHT:  FHR: 135 bpm, variability: moderate,  accelerations:  Present,  decelerations:  Present variable Category/reactivity:  Category II UC:   Regular, every 2 minutes SVE:    Dilation: 5cm  Effacement: 80%  Station:  0  Consistency: soft  Position: middle  Membrane status: AROM 1547 Amniotic color: clear  Labs: Lab Results  Component Value Date   WBC 9.4 11/12/2018   HGB 11.0 (L) 11/12/2018   HCT 33.5 (L) 11/12/2018   MCV 91.5 11/12/2018   PLT 199 11/12/2018    Assessment / Plan: Elective induction of labor, latent stage  Labor: s/p one dose of misoprostol, s/p AROM now, pitocin started at 1536, currently infusing at 70mu/min Fetal Wellbeing:  Category II for variable decels, overall reassuring Pain Control:  Desires epidural, to request now Anticipated MOD:  NSVD  Genia Del, CNM 11/12/2018, 3:49 PM

## 2018-11-12 NOTE — Discharge Summary (Signed)
Obstetric Discharge Summary   Patient Name: Tiffany Sherman DOB: 07/25/1996 MRN: [id number]  Date of Admission: 11/12/2018 Date of Delivery: 11/12/2018 Delivered by: Genia Del, CNM Date of Discharge: 11/14/2018 Primary OB: [facility] LMP:No LMP recorded (lmp unknown). Patient is pregnant. EDC Estimated Date of Delivery: 11/07/18 Gestational Age at Delivery: [redacted]w[redacted]d   Antepartum complications:  1.Anemia on iron supplementation 2. Initial prenatal visit at [facility] at 11 weeks, reentered care 26 weeks at [facility] fractured care 3. Tobacco use in pregnancy, currently smoking 1/2 PPD 4. Urine drug screen positive for THC at new OB visit, most recheck screen negative per records 5. 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 History of trich during pregnancy, treated, resolved as of 11/09/2018 7. Low-lying placenta, resolved on [redacted]w[redacted]d u/s on 08/17/18 8. Hx 2nd degree with bilateral labial lacerations with repair in the OR with firstdelivery  Admitting Diagnosis: elective induction of labor  Secondary Diagnoses: Patient Active Problem List   Diagnosis Date Noted  . Post-term pregnancy, 40-42 weeks of gestation 11/12/2018  . Decreased fetal movement affecting management of mother, antepartum 10/26/2018  . Gastroesophageal reflux 04/04/2013  . Pregnancy 04/04/2013    Induction/Augmentation: AROM, Pitocin and Cytotec Complications: None Intrapartum complications/course: Tiffany Sherman presented to L&D for elective induction at term. She was induced with misoprostol and augmented with pitocin and AROM. Epidural placed. She progressed to complete and had a spontaneous vaginal birth of a live female over a midline episiotomy performed under epidural anesthesia. Episiotomy cut due to prolonged deceleration to the 70s while crowning with very effective pushing. The fetal head was delivered in direct OA position with restitution to LOA with  right nuchal hand. No nuchal cord. Anterior then posterior shoulders delivered spontaneously. Baby placed on mom's abdomen and attended to by transition RN and NNP. Cord clamped and cut when pulseless by grandmother of the baby. Delivery Type: spontaneous vaginal delivery Anesthesia: epidural Placenta: spontaneous Laceration: 2nd degree, b/l labial Episiotomy: midline  Newborn Data: Live born female  Birth Weight:  APGAR: 8, 9  Newborn Delivery   Birth date/time:  11/12/2018 16:55:00 Delivery type:  Vaginal, Spontaneous     Postpartum Course  Patient had an uncomplicated postpartum course.  By time of discharge on PPD#2, her pain was controlled on oral pain medications; she had appropriate lochia and was ambulating, voiding without difficulty and tolerating regular diet.  She was deemed stable for discharge to home.  C/o epidural site back pain , improving on d/c      Labs: CBC Latest Ref Rng & Units 11/13/2018 11/12/2018 02/22/2015  WBC 4.0 - 10.5 K/uL 12.3(H) 9.4 5.7  Hemoglobin 12.0 - 15.0 g/dL 6.5(H) 11.0(L) 13.0  Hematocrit 36.0 - 46.0 % 29.6(L) 33.5(L) 40.4  Platelets 150 - 400 K/uL 166 199 226   A POS  Physical exam:  BP 115/68 (BP Location: Left Arm)   Pulse 92   Temp 98.6 F (37 C) (Oral)   Resp 20   Ht 5\' 9"  (1.753 m)   Wt 80.7 kg   LMP  (LMP Unknown)   SpO2 99%   BMI 26.29 kg/m  General: alert and no distress Pulm: normal respiratory effort Lochia: appropriate Abdomen: soft, NT Uterine Fundus: firm, below umbilicus Extremities: No evidence of DVT seen on physical exam. No lower extremity edema.   Disposition: stable, discharge to home Baby Feeding: breastmilk & formula Baby Disposition: home with mom  Contraception: Nexplanon  Prenatal Labs:  Blood type/Rh A+  Antibody screen neg  Rubella immune  Varicella immune  RPR NR  HBsAg Neg  HIV NR  GC neg  Chlamydia neg  Genetic screening Not completed  1 hour GTT 85  3 hour GTT n/a  GBS negative     Rh Immune globulin given: n/a Rubella vaccine given: n/a Tdap vaccine given in AP or PP setting: 08/17/2018 Flu vaccine given in AP or PP setting: 08/04/2018  Plan:  Tiffany Sherman was discharged to home in good condition. Follow-up appointment at [facility] with delivery provider in 6 weeks  Discharge Instructions: Per After Visit Summary. Activity: Advance as tolerated. Pelvic rest for 6 weeks.   Diet: Regular Discharge Medications: Allergies as of 11/14/2018      Reactions   Bee Venom    Calamine    Latex       Medication List    STOP taking these medications   ferrous sulfate 325 (65 FE) MG EC tablet     TAKE these medications   benzocaine-Menthol 20-0.5 % Aero Commonly known as:  DERMOPLAST Apply 1 application topically as needed for irritation (perineal discomfort).   ibuprofen 600 MG tablet Commonly known as:  ADVIL,MOTRIN Take 1 tablet (600 mg total) by mouth every 6 (six) hours.   multivitamin-prenatal 27-0.8 MG Tabs tablet Take 1 tablet by mouth daily at 12 noon.      Outpatient follow up:  Follow-up Information    Genia Del, CNM. Schedule an appointment as soon as possible for a visit in 6 week(s).   Specialty:  Certified Nurse Midwife Why:  For routine postpartum visit Contact information: [address] [place] [address] [phone]            Signed: Jennell Corner MD

## 2018-11-12 NOTE — Anesthesia Preprocedure Evaluation (Signed)
Anesthesia Evaluation  Patient identified by MRN, date of birth, ID band Patient awake    Reviewed: Allergy & Precautions, H&P , NPO status , Patient's Chart, lab work & pertinent test results  History of Anesthesia Complications Negative for: history of anesthetic complications  Airway Mallampati: II       Dental no notable dental hx. (+) Teeth Intact   Pulmonary Current Smoker,           Cardiovascular      Neuro/Psych  Headaches,    GI/Hepatic GERD  ,  Endo/Other    Renal/GU      Musculoskeletal   Abdominal   Peds  Hematology   Anesthesia Other Findings   Reproductive/Obstetrics (+) Pregnancy                             Anesthesia Physical Anesthesia Plan  ASA: II  Anesthesia Plan: Epidural   Post-op Pain Management:    Induction:   PONV Risk Score and Plan:   Airway Management Planned:   Additional Equipment:   Intra-op Plan:   Post-operative Plan:   Informed Consent: I have reviewed the patients History and Physical, chart, labs and discussed the procedure including the risks, benefits and alternatives for the proposed anesthesia with the patient or authorized representative who has indicated his/her understanding and acceptance.       Plan Discussed with: Anesthesiologist  Anesthesia Plan Comments:         Anesthesia Quick Evaluation

## 2018-11-12 NOTE — Progress Notes (Signed)
Labor Progress Note  Tiffany Sherman is a 23 y.o. [id number] at [redacted]w[redacted]d by [redacted]w[redacted]d ultrasound admitted for elective induction of labor at term.   Subjective:  Starting to feel more uncomfortable.   Objective: BP 114/70 (BP Location: Left Arm)   Pulse 78   Temp 98.2 F (36.8 C) (Oral)   Resp 18   Ht 5\' 9"  (1.753 m)   Wt 80.7 kg   LMP  (LMP Unknown)   BMI 26.29 kg/m   Fetal Assessment: FHT:  FHR: 135 bpm, variability: moderate,  accelerations:  Present,  decelerations:  Absent Category/reactivity:  Category I UC:   Irregular, not tracing well, but frequent per patient report SVE:    Dilation: 4cm  Effacement: 70%  Station:  0  Consistency: medium  Position: middle  Membrane status: inatct Amniotic color: n/a  Labs: Lab Results  Component Value Date   WBC 9.4 11/12/2018   HGB 11.0 (L) 11/12/2018   HCT 33.5 (L) 11/12/2018   MCV 91.5 11/12/2018   PLT 199 11/12/2018    Assessment / Plan: Elective induction of labor, latent stage  Labor: s/p one dose of misoprostol, plan for pitocin and AROM after epidural placement Fetal Wellbeing:  Category I Pain Control:  Desires epidural, to request now Anticipated MOD:  NSVD  Genia Del, CNM 11/12/2018, 1:24 PM

## 2018-11-12 NOTE — H&P (Signed)
OB History & Physical   History of Present Illness:  Chief Complaint: induction of labor  HPI:  JESELLE Sherman is a 23 y.o. [phone] female at [redacted]w[redacted]d dated by [redacted]w[redacted]d ultrasound.  She presents to L&D for elective induction of labor at term.   She reports:  -active fetal movement -no leakage of fluid  -no vaginal bleeding -no contractions  Pregnancy Issues: 1. Anemia on iron supplementation 2. Initial prenatal visit at [facility] at 11 weeks, reentered care 26 weeks at [facility] fractured care 3. Tobacco use in pregnancy, currently smoking 1/2 PPD 4. Urine drug screen positive for THC at new OB visit, most recheck screen negative per records 5. 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 History of trich during pregnancy, treated, resolved as of 11/09/2018 7. Low-lying placenta, resolved on [redacted]w[redacted]d u/s on 08/17/18 8. Hx 2nd degree with bilateral labial lacerations with repair in the OR with first delivery   Maternal Medical History:   Past Medical History:  Diagnosis Date  . Broken foot   . Headache     Past Surgical History:  Procedure Laterality Date  . VAGINAL WOUND CLOSURE / REPAIR     Per pt 80 stitiches    Allergies  Allergen Reactions  . Bee Venom   . Calamine   . Latex     Prior to Admission medications   Medication Sig Start Date End Date Taking? Authorizing Provider  ferrous sulfate 325 (65 FE) MG EC tablet Take 325 mg by mouth 3 (three) times daily with meals.    [provider]  Prenatal Vit-Fe Fumarate-FA (MULTIVITAMIN-PRENATAL) 27-0.8 MG TABS tablet Take 1 tablet by mouth daily at 12 noon.    [provider]     Pren[facility] care site: [facility]  Social History: She  reports that she has been smoking cigarettes. She has been smoking about 0.50 packs per day. She has never used smokeless tobacco. She reports that she does not drink alcohol or use drugs.  Family History: family history includes  Anemia in her mother; Bipolar disorder in her father; Cancer in her father; Heart disease in her father and mother; Hepatitis C in her father; Hypertension in her mother; Kidney disease in her father; Liver disease in her father; Obesity in her mother; Skin cancer in her father; Sleep apnea in her paternal grandmother.   Review of Systems: A full review of systems was performed and negative except as noted in the HPI.    Physical Exam:  Vital Signs: BP 114/70 (BP Location: Left Arm)   Pulse 78   Temp 98.2 F (36.8 C) (Oral)   Resp 18   Ht 5\' 9"  (1.753 m)   Wt 80.7 kg   LMP  (LMP Unknown)   BMI 26.29 kg/m   General:   alert, cooperative, appears stated age and no distress  Skin:  normal and no rash or abnormalities  Neurologic:    Alert & oriented x 3  Lungs:   clear to auscultation bilaterally  Heart:   regular rate and rhythm, S1, S2 normal, no murmur, click, rub or gallop  Abdomen:  soft, non-tender; bowel sounds normal; no masses,  no organomegaly  Pelvis:  External genitalia: normal general appearance and no evidence of HSV lesions Vaginal: normal mucosa without prolapse or lesions and no evidence of HSV lesions  FHT:  140 BPM  Presentations: cephalic  Cervix:    Dilation: 3-4cm   Effacement: 50%  Station:  -2   Consistency: medium   Position: middle  Extremities: : non-tender, symmetric, no edema bilaterally.     EFW: 7lb 6oz  Results for orders placed or performed during the hospital encounter of 11/12/18 (from the past 24 hour(s))  CBC     Status: Abnormal   Collection Time: 11/12/18  8:48 AM  Result Value Ref Range   WBC 9.4 4.0 - 10.5 K/uL   RBC 3.66 (L) 3.87 - 5.11 MIL/uL   Hemoglobin 11.0 (L) 12.0 - 15.0 g/dL   HCT 84.6 (L) 96.2 - 95.2 %   MCV 91.5 80.0 - 100.0 fL   MCH 30.1 26.0 - 34.0 pg   MCHC 32.8 30.0 - 36.0 g/dL   RDW 84.1 32.4 - 40.1 %   Platelets 199 150 - 400 K/uL   nRBC 0.0 0.0 - 0.2 %    Pertinent Results:  Prenatal Labs: Blood type/Rh A+   Antibody screen neg  Rubella immune  Varicella immune  RPR NR  HBsAg Neg  HIV NR  GC neg  Chlamydia neg  Genetic screening Not completed  1 hour GTT 85  3 hour GTT n/a  GBS negative    FHT: FHR: 135 bpm, variability: moderate,  accelerations:  Present,  decelerations:  Absent Category/reactivity:  Category I TOCO: none  Assessment:  Tiffany Sherman is a 23 y.o. [id number] female at [redacted]w[redacted]d with elective induction of labor at term.   Plan:  1. Admit to Labor & Delivery; consents reviewed and obtained  2. Fetal Well being  - Fetal Tracing: category I - GBS negative - Presentation: cephalic confirmed by vaginal exam   3. Routine OB: - Prenatal labs reviewed, as above - Rh positive - CBC & T&S on admit - Clear fluids, IVF  4. Induction of Labor -  Contractions to be monitored with external toco in place -  Pelvis proven to 3629g -  Plan for induction with misoprostol -  Plan for continuous fetal monitoring  -  Maternal pain control as desired: IVPM, nitrous, regional anesthesia -  Anticipate vaginal delivery  5. Post Partum Planning: - Infant feeding: undecided - Contraception: Nexplanon  Genia Del, CNM 11/12/2018 9:42 AM ----- Genia Del Certified Nurse Midwife [facility], Department of OB/GYN [facility]

## 2018-11-12 NOTE — Discharge Instructions (Signed)
After Your Delivery Discharge Instructions °  °Postpartum: Care Instructions ° °After childbirth (postpartum period), your body goes through many changes. Some of these changes happen over several weeks. In the hours after delivery, your body will begin to recover from childbirth while it prepares to breastfeed your newborn. You may feel emotional during this time. Your hormones can shift your mood without warning for no clear reason. ° °In the first couple of weeks after childbirth, many women have emotions that change from happy to sad. You may find it hard to sleep. You may cry a lot. This is called the "baby blues." These overwhelming emotions often go away within a couple of days or weeks. But it's important to discuss your feelings with your doctor. ° °You should call your care provider if you have unrelieved feelings of: °· Inability to cope °· Sadness °· Anxiety °· Lack of interest in baby °· Insomnia °· Crying ° °It is easy to get too tired and overwhelmed during the first weeks after childbirth. Don't try to do too much. Get rest whenever you can, accept help from others, and eat well and drink plenty of fluids. ° °About 4 to 6 weeks after your baby's birth, you will have a follow-up visit with your care provider. This visit is your time to talk to your provider about anything you are concerned or curious about. ° °Follow-up care is a key part of your treatment and safety. Be sure to make and go to all appointments, and call your doctor if you are having problems. It's also a good idea to know your test results and keep a list of the medicines you take. ° °How can you care for yourself at home? °· Sleep or rest when your baby sleeps. °· Get help with household chores from family or friends, if you can. Do not try to do it all yourself. °· If you have hemorrhoids or swelling or pain around the opening of your vagina, try using cold and heat. You can put ice or a cold pack on the area for 10 to 20 minutes at  a time. Put a thin cloth between the ice and your skin. Also try sitting in a few inches of warm water (sitz bath) 3 times a day and after bowel movements. °· Take pain medicines exactly as directed. °· If the provider gave you a prescription medicine for pain, take it as prescribed. °· If you do not have a prescription and need something over the counter, you can take: °· Ibuprofen (Motrin, Advil) up to 600mg every 6 hours as needed for pain °· Acetaminophen (Tylenol) up to 650mg every 4 hours as needed for pain °· Some people find it helpful to alternate between these two medications.  °· No driving for 1-2 weeks or while taking pain medications.  °· Eat more fiber to avoid constipation. Include foods such as whole-grain breads and cereals, raw vegetables, raw and dried fruits, and beans. °· Drink plenty of fluids, enough so that your urine is light yellow or clear like water. If you have kidney, heart, or liver disease and have to limit fluids, talk with your doctor before you increase the amount of fluids you drink. °· Do not put anything in the vagina for 6 weeks. This means no sex, no tampons, no douching, and no enemas. °· If you have stitches, keep the area clean by pouring or spraying warm water over the area outside your vagina and anus after you use the toilet. °·   No strenuous activity or heavy lifting for 6 weeks   No tub baths; showers only  Continue prenatal vitamin and iron.  If breastfeeding:  Increase calories and fluids while breastfeeding.  You may have a slight fever when your milk comes in, but it should go away on its own. If it does not, and rises above 101.0 please call the doctor.  For breastfeeding concerns, the lactation consultant can be reached at [phone].  For concerns about your baby, please call your pediatrician.   Keep a list of questions to bring to your postpartum visit. Your questions might be about:  Changes in your breasts, such as lumps or  soreness.  When to expect your menstrual period to start again.  What form of birth control is best for you.  Weight you have put on during the pregnancy.  Exercise options.  What foods and drinks are best for you, especially if you are breastfeeding.  Problems you might be having with breastfeeding.  When you can have sex. Some women may want to talk about lubricants for the vagina.  Any feelings of sadness or restlessness that you are having.   When should you call for help?  Call 911 anytime you think you may need emergency care. For example, call if:  You have thoughts of harming yourself, your baby, or another person.  You passed out (lost consciousness).  Call the office at [phone] or seek immediate medical care if:  If you have heavy bleeding such that you are soaking 1 pad in an hour for 2 hours  You are dizzy or lightheaded, or you feel like you may faint.  You have a fever; a temperature of 101.0 F or greater  Chills  Difficulty urinating  Headache unrelieved by "pain meds"   Visual changes  Pain in the right side of your belly near your ribs  Breasts reddened, hard, hot to the touch or any other breast concerns  Nipple discharge which is foul-smelling or contains pus   Increased pain at the site of the episiotomy   New pain unrelieved with recommended over-the-counter dosages  Difficulty breathing with or without chest pain   New leg pain, swelling, or redness, especially if it is only on one leg  Any other concerns  Watch closely for changes in your health, and be sure to contact your provider if:  You have new or worse vaginal discharge.  You feel sad or depressed.  You are having problems with your breasts or breastfeeding.

## 2018-11-13 LAB — CBC
HCT: 29.6 % — ABNORMAL LOW (ref 36.0–46.0)
Hemoglobin: 9.6 g/dL — ABNORMAL LOW (ref 12.0–15.0)
MCH: 30.1 pg (ref 26.0–34.0)
MCHC: 32.4 g/dL (ref 30.0–36.0)
MCV: 92.8 fL (ref 80.0–100.0)
Platelets: 166 10*3/uL (ref 150–400)
RBC: 3.19 MIL/uL — ABNORMAL LOW (ref 3.87–5.11)
RDW: 13.6 % (ref 11.5–15.5)
WBC: 12.3 10*3/uL — ABNORMAL HIGH (ref 4.0–10.5)
nRBC: 0 % (ref 0.0–0.2)

## 2018-11-13 LAB — RPR: RPR Ser Ql: NONREACTIVE

## 2018-11-13 NOTE — Anesthesia Postprocedure Evaluation (Signed)
Anesthesia Post Note  Patient: Tiffany Sherman  Procedure(s) Performed: AN AD HOC LABOR EPIDURAL  Patient location during evaluation: Mother Baby Anesthesia Type: Epidural Level of consciousness: oriented and awake and alert Pain management: pain level controlled Vital Signs Assessment: post-procedure vital signs reviewed and stable Respiratory status: spontaneous breathing and respiratory function stable Cardiovascular status: blood pressure returned to baseline and stable Postop Assessment: no headache, no backache, no apparent nausea or vomiting and able to ambulate Anesthetic complications: no     Last Vitals:  Vitals:   11/13/18 0500 11/13/18 0757  BP: 102/67 107/68  Pulse: 63 65  Resp: 20 18  Temp:  36.7 C  SpO2: 98% 100%    Last Pain:  Vitals:   11/13/18 0757  TempSrc: Oral  PainSc:                  Starling Manns

## 2018-11-13 NOTE — Progress Notes (Signed)
Post Partum Day #1 Subjective: epidural site sore   Objective: Blood pressure 115/81, pulse 89, temperature 97.8 F (36.6 C), temperature source Oral, resp. rate 18, height 5\' 9"  (1.753 m), weight 80.7 kg, SpO2 99 %.  Physical Exam:  General: alert and cooperative Lochia: appropriate Uterine Fundus: firm Incision: not checked  DVT Evaluation: No evidence of DVT seen on physical exam. Back : no redness around the CLE site  Recent Labs    11/12/18 0848 11/13/18 0455  HGB 11.0* 9.6*  HCT 33.5* 29.6*    Assessment/Plan: Plan for discharge tomorrow dermaplast to perineum   LOS: 1 day   Ihor Austin Schermerhorn 11/13/2018, 5:36 PM

## 2018-11-14 MED ORDER — IBUPROFEN 600 MG PO TABS: 600.0000 mg | ORAL_TABLET | Freq: Four times a day (QID) | ORAL | 0 refills | Status: DC

## 2018-11-14 MED ORDER — BENZOCAINE-MENTHOL 20-0.5 % EX AERO: 1.0000 "application " | INHALATION_SPRAY | CUTANEOUS | 1 refills | Status: DC | PRN

## 2018-11-14 NOTE — Plan of Care (Signed)
Patient's vital signs stable; fundus firm; small amount rubra lochia; voiding; stooled; good appetite; good po fluids; pain controlled with po motrin and po tylenol; ambulating in hall; goes outside to smoke; bottle feeding infant with good technique observed; good maternal-infant bonding observed; patient's mother at bedside and attentive.

## 2018-11-14 NOTE — Progress Notes (Signed)
Reviewed D/C instructions with pt and family. Pt verbalized understanding of teaching. Discharged to home via W/C. Pt to schedule f/u appt.  

## 2019-01-30 ENCOUNTER — Encounter (HOSPITAL_COMMUNITY): Payer: Self-pay

## 2019-05-04 ENCOUNTER — Emergency Department
Admission: EM | Admit: 2019-05-04 | Discharge: 2019-05-04 | Disposition: A | Discharge status: Home / Self Care | Payer: Medicaid Other | Attending: Emergency Medicine | Admitting: Emergency Medicine

## 2019-05-04 ENCOUNTER — Other Ambulatory Visit: Payer: Self-pay

## 2019-05-04 ENCOUNTER — Encounter: Payer: Self-pay | Admitting: Emergency Medicine

## 2019-05-04 DIAGNOSIS — Z79899 Other long term (current) drug therapy: Secondary | ICD-10-CM | POA: Insufficient documentation

## 2019-05-04 DIAGNOSIS — L0501 Pilonidal cyst with abscess: Secondary | ICD-10-CM | POA: Insufficient documentation

## 2019-05-04 DIAGNOSIS — L728 Other follicular cysts of the skin and subcutaneous tissue: Secondary | ICD-10-CM | POA: Diagnosis present

## 2019-05-04 DIAGNOSIS — F17210 Nicotine dependence, cigarettes, uncomplicated: Secondary | ICD-10-CM | POA: Insufficient documentation

## 2019-05-04 MED ORDER — SULFAMETHOXAZOLE-TRIMETHOPRIM 800-160 MG PO TABS: 1.0000 | ORAL_TABLET | Freq: Two times a day (BID) | ORAL | 0 refills | Status: DC

## 2019-05-04 MED ORDER — SULFAMETHOXAZOLE-TRIMETHOPRIM 800-160 MG PO TABS
1.0000 | ORAL_TABLET | Freq: Once | ORAL | Status: AC
  Administered 2019-05-04: 1 via ORAL
  Filled 2019-05-04: qty 1

## 2019-05-04 MED ORDER — HYDROCODONE-ACETAMINOPHEN 5-325 MG PO TABS: 1.0000 | ORAL_TABLET | ORAL | 0 refills | Status: DC | PRN

## 2019-05-04 MED ORDER — LIDOCAINE HCL (PF) 1 % IJ SOLN
10.0000 mL | Freq: Once | INTRAMUSCULAR | Status: AC
  Administered 2019-05-04: 10 mL
  Filled 2019-05-04: qty 10

## 2019-05-04 NOTE — ED Provider Notes (Signed)
[facility] Emergency Department Provider Note  ____________________________________________  Time seen: Approximately 6:13 PM  I have reviewed the triage vital signs and the nursing notes.   HISTORY  Chief Complaint Abscess    HPI Tiffany Sherman is a 23 y.o. female who presents emergency department complaining of a cyst to the intergluteal cleft.  Patient reports that she first developed a lesion to the superior intergluteal cleft approximately a week ago.  Patient reports that it popped, draining a purulent material then flattened and pain resolved.  Patient reports approximately 4 days ago it returned and has been draining initially but is not draining at this time.  Pain is increasing.  No history of recurrent skin lesions.  No history of pilonidal cyst in the past.  Patient was not treated for these lesions prior to today.         Past Medical History:  Diagnosis Date  . Broken foot   . Headache     Patient Active Problem List   Diagnosis Date Noted  . Post-term pregnancy, 40-42 weeks of gestation 11/12/2018  . Decreased fetal movement affecting management of mother, antepartum 10/26/2018  . Gastroesophageal reflux 04/04/2013  . Pregnancy 04/04/2013    Past Surgical History:  Procedure Laterality Date  . VAGINAL WOUND CLOSURE / REPAIR     Per pt 80 stitiches    Prior to Admission medications   Medication Sig Start Date End Date Taking? Authorizing Provider  benzocaine-Menthol (DERMOPLAST) 20-0.5 % AERO Apply 1 application topically as needed for irritation (perineal discomfort). 11/14/18   Schermerhorn, Ihor Austinhomas J, MD  HYDROcodone-acetaminophen (NORCO/VICODIN) 5-325 MG tablet Take 1 tablet by mouth every 4 (four) hours as needed for moderate pain. 05/04/19   Jaina Morin, Delorise RoyalsJonathan D, PA-C  sulfamethoxazole-trimethoprim (BACTRIM DS) 800-160 MG tablet Take 1 tablet by mouth 2 (two) times daily. 05/04/19   Chibuikem Thang, Delorise RoyalsJonathan D, PA-C     Allergies Bee venom, Calamine, and Latex  Family History  Problem Relation Age of Onset  . Heart disease Mother   . Anemia Mother   . Hypertension Mother   . Obesity Mother   . Cancer Father   . Heart disease Father   . Kidney disease Father   . Liver disease Father   . Hepatitis C Father   . Skin cancer Father   . Bipolar disorder Father   . Sleep apnea Paternal Grandmother     Social History Social History   Tobacco Use  . Smoking status: Current Every Day Smoker    Packs/day: 0.50    Types: Cigarettes  . Smokeless tobacco: Never Used  Substance Use Topics  . Alcohol use: No  . Drug use: No     Review of Systems  Constitutional: No fever/chills Eyes: No visual changes. No discharge ENT: No upper respiratory complaints. Cardiovascular: no chest pain. Respiratory: no cough. No SOB. Gastrointestinal: No abdominal pain.  No nausea, no vomiting.  No diarrhea.  No constipation. Musculoskeletal: Negative for musculoskeletal pain. Skin: Negative for rash, abrasions, lacerations, ecchymosis. Superior intergluteal "cyst" Neurological: Negative for headaches, focal weakness or numbness. 10-point ROS otherwise negative.  ____________________________________________   PHYSICAL EXAM:  VITAL SIGNS: ED Triage Vitals  Enc Vitals Group     BP 05/04/19 1751 133/88     Pulse Rate 05/04/19 1751 (!) 127     Resp 05/04/19 1751 20     Temp 05/04/19 1751 99.3 F (37.4 C)     Temp Source 05/04/19 1751 Oral  SpO2 05/04/19 1751 97 %     Weight 05/04/19 1750 170 lb (77.1 kg)     Height 05/04/19 1750 5\' 9"  (1.753 m)     Head Circumference --      Peak Flow --      Pain Score --      Pain Loc --      Pain Edu? --      Excl. in [place]? --      Constitutional: Alert and oriented. Well appearing and in no acute distress. Eyes: Conjunctivae are normal. PERRL. EOMI. Head: Atraumatic. ENT:      Ears:       Nose: No congestion/rhinnorhea.      Mouth/Throat: Mucous  membranes are moist.  Neck: No stridor.    Cardiovascular: Normal rate, regular rhythm. Normal S1 and S2.  Good peripheral circulation. Respiratory: Normal respiratory effort without tachypnea or retractions. Lungs CTAB. Good air entry to the bases with no decreased or absent breath sounds. Musculoskeletal: Full range of motion to all extremities. No gross deformities appreciated. Neurologic:  Normal speech and language. No gross focal neurologic deficits are appreciated.  Skin:  Skin is warm, dry and intact. No rash noted.  Visualization of the intergluteal cleft reveals erythematous and edematous lesion to the superior aspect.  Palpation reveals significant tenderness.  Erythema encompasses lesion which measures approximately 2 x 1 cm.  No surrounding erythema or edema.  The patient is mildly tender to palpation in the surrounding tissue.  Lesion is fluctuant.  No purulent drainage.  No visible streaking.  No visible external tract. Psychiatric: Mood and affect are normal. Speech and behavior are normal. Patient exhibits appropriate insight and judgement.   ____________________________________________   LABS (all labs ordered are listed, but only abnormal results are displayed)  Labs Reviewed - No data to display ____________________________________________  EKG   ____________________________________________  RADIOLOGY   No results found.  ____________________________________________    PROCEDURES  Procedure(s) performed:    Procedures    Medications  lidocaine (PF) (XYLOCAINE) 1 % injection 10 mL (10 mLs Infiltration Given by Other 05/04/19 1842)  sulfamethoxazole-trimethoprim (BACTRIM DS) 800-160 MG per tablet 1 tablet (1 tablet Oral Given 05/04/19 1909)     ____________________________________________   INITIAL IMPRESSION / ASSESSMENT AND PLAN / ED COURSE  Pertinent labs & imaging results that were available during my care of the patient were reviewed by me and  considered in my medical decision making (see chart for details).  Review of the [place] CSRS was performed in accordance of the [place] prior to dispensing any controlled drugs.           Patient's diagnosis is consistent with pilonidal abscess.  Patient presented to the emergency department with an abscess at the superior aspect of gluteal cleft.  Patient reports that she has had 3 abscesses to this area within the past 10 days.  They have ruptured, drained spontaneously at home with the exception of this 1.  Area is very tender.  Fluctuant on exam.  Incision and drainage was performed with moderate amount of drainage.  Abscess is very superficial in nature and does not require packing at this time.  Patiently placed on antibiotics and advised to follow-up with general surgery..  Patient is also given a prescription for limited pain medication.  Patient is given ED precautions to return to the ED for any worsening or new symptoms.     ____________________________________________  FINAL CLINICAL IMPRESSION(S) / ED DIAGNOSES  Final diagnoses:  Pilonidal  abscess      NEW MEDICATIONS STARTED DURING THIS VISIT:  ED Discharge Orders         Ordered    sulfamethoxazole-trimethoprim (BACTRIM DS) 800-160 MG tablet  2 times daily     05/04/19 1933    HYDROcodone-acetaminophen (NORCO/VICODIN) 5-325 MG tablet  Every 4 hours PRN     05/04/19 1933              This chart was dictated using voice recognition software/Dragon. Despite best efforts to proofread, errors can occur which can change the meaning. Any change was purely unintentional.    Lanette HampshireCuthriell, Zeno Hickel D, PA-C 05/04/19 2009    Arnaldo NatalMalinda, Paul F, MD 05/04/19 [phone]

## 2019-05-04 NOTE — ED Notes (Signed)
See triage note Presents with possible abscess to tailbone  States she thought one area was leaking   But now thinks area is larger

## 2019-05-04 NOTE — ED Triage Notes (Signed)
Pt to ER with pilonidal cyst that is infected.  Pt states has noticed for last 4-5 days and getting worse.

## 2019-10-10 ENCOUNTER — Encounter: Payer: Self-pay | Admitting: Intensive Care

## 2019-10-10 ENCOUNTER — Emergency Department
Admission: EM | Admit: 2019-10-10 | Discharge: 2019-10-10 | Disposition: A | Discharge status: Home / Self Care | Payer: Medicaid Other | Attending: Emergency Medicine | Admitting: Emergency Medicine

## 2019-10-10 ENCOUNTER — Other Ambulatory Visit: Payer: Self-pay

## 2019-10-10 DIAGNOSIS — R61 Generalized hyperhidrosis: Secondary | ICD-10-CM | POA: Insufficient documentation

## 2019-10-10 DIAGNOSIS — L0501 Pilonidal cyst with abscess: Secondary | ICD-10-CM | POA: Insufficient documentation

## 2019-10-10 DIAGNOSIS — F17210 Nicotine dependence, cigarettes, uncomplicated: Secondary | ICD-10-CM | POA: Diagnosis not present

## 2019-10-10 DIAGNOSIS — F1210 Cannabis abuse, uncomplicated: Secondary | ICD-10-CM | POA: Insufficient documentation

## 2019-10-10 DIAGNOSIS — Z91040 Latex allergy status: Secondary | ICD-10-CM | POA: Diagnosis not present

## 2019-10-10 DIAGNOSIS — L0291 Cutaneous abscess, unspecified: Secondary | ICD-10-CM | POA: Diagnosis present

## 2019-10-10 MED ORDER — LIDOCAINE HCL (PF) 1 % IJ SOLN
5.0000 mL | Freq: Once | INTRAMUSCULAR | Status: AC
  Administered 2019-10-10: 5 mL via INTRADERMAL
  Filled 2019-10-10: qty 5

## 2019-10-10 MED ORDER — SULFAMETHOXAZOLE-TRIMETHOPRIM 800-160 MG PO TABS: 1.0000 | ORAL_TABLET | Freq: Two times a day (BID) | ORAL | 0 refills | Status: DC

## 2019-10-10 MED ORDER — ACETAMINOPHEN 325 MG PO TABS
650.0000 mg | ORAL_TABLET | Freq: Once | ORAL | Status: AC
  Administered 2019-10-10: 650 mg via ORAL
  Filled 2019-10-10: qty 2

## 2019-10-10 MED ORDER — SULFAMETHOXAZOLE-TRIMETHOPRIM 800-160 MG PO TABS
1.0000 | ORAL_TABLET | Freq: Once | ORAL | Status: AC
  Administered 2019-10-10: 1 via ORAL
  Filled 2019-10-10: qty 1

## 2019-10-10 NOTE — ED Triage Notes (Signed)
Patient reports abscess on tailbone. HX same and has had it drained here at [facility] before

## 2019-10-10 NOTE — Discharge Instructions (Signed)
You can pull the packing out in 2 days. Please begin antibiotics in the morning. Please call general surgery for a follow up appointment.

## 2019-10-10 NOTE — ED Provider Notes (Signed)
[facility] Emergency Department Provider Note  ____________________________________________  Time seen: Approximately 7:51 PM  I have reviewed the triage vital signs and the nursing notes.   HISTORY  Chief Complaint Abscess    HPI Tiffany Sherman is a 24 y.o. female that presents to the emergency department for evaluation of abscess to tailbone for 1 week.  Patient had a similar a couple of months ago.  Abscess resolved following incision and drainage but has returned.  Patient has not had a fever.  She does however get "sweats" when she hits the abscess on something. She was given a referral to general surgery last time but she could not find the paperwork this week.   Past Medical History:  Diagnosis Date  . Broken foot   . Headache     Patient Active Problem List   Diagnosis Date Noted  . Post-term pregnancy, 40-42 weeks of gestation 11/12/2018  . Decreased fetal movement affecting management of mother, antepartum 10/26/2018  . Gastroesophageal reflux 04/04/2013  . Pregnancy 04/04/2013    Past Surgical History:  Procedure Laterality Date  . VAGINAL WOUND CLOSURE / REPAIR     Per pt 80 stitiches    Prior to Admission medications   Medication Sig Start Date End Date Taking? Authorizing Provider  benzocaine-Menthol (DERMOPLAST) 20-0.5 % AERO Apply 1 application topically as needed for irritation (perineal discomfort). 11/14/18   Schermerhorn, Ihor Austin, MD  HYDROcodone-acetaminophen (NORCO/VICODIN) 5-325 MG tablet Take 1 tablet by mouth every 4 (four) hours as needed for moderate pain. 05/04/19   Cuthriell, Delorise Royals, PA-C  sulfamethoxazole-trimethoprim (BACTRIM DS) 800-160 MG tablet Take 1 tablet by mouth 2 (two) times daily. 10/10/19   Enid Derry, PA-C    Allergies Bee venom, Calamine, and Latex  Family History  Problem Relation Age of Onset  . Heart disease Mother   . Anemia Mother   . Hypertension Mother   . Obesity Mother   . Cancer  Father   . Heart disease Father   . Kidney disease Father   . Liver disease Father   . Hepatitis C Father   . Skin cancer Father   . Bipolar disorder Father   . Sleep apnea Paternal Grandmother     Social History Social History   Tobacco Use  . Smoking status: Current Every Day Smoker    Packs/day: 0.50    Types: Cigarettes  . Smokeless tobacco: Never Used  Substance Use Topics  . Alcohol use: No  . Drug use: Yes    Types: Marijuana     Review of Systems  Constitutional: No fever/chills Cardiovascular: No chest pain. Respiratory: No SOB. Gastrointestinal: No abdominal pain.  No nausea, no vomiting.  Musculoskeletal: Negative for musculoskeletal pain. Skin: Negative for abrasions, lacerations, ecchymosis. Positive for rash.   ____________________________________________   PHYSICAL EXAM:  VITAL SIGNS: ED Triage Vitals [10/10/19 1823]  Enc Vitals Group     BP 136/74     Pulse Rate 98     Resp 16     Temp 99.2 F (37.3 C)     Temp Source Oral     SpO2 98 %     Weight 180 lb (81.6 kg)     Height 5\' 9"  (1.753 m)     Head Circumference      Peak Flow      Pain Score 10     Pain Loc      Pain Edu?      Excl. in [place]?  Constitutional: Alert and oriented. Well appearing and in no acute distress. Eyes: Conjunctivae are normal. PERRL. EOMI. Head: Atraumatic. ENT:      Ears:      Nose: No congestion/rhinnorhea.      Mouth/Throat: Mucous membranes are moist.  Neck: No stridor.  Cardiovascular: Normal rate, regular rhythm.  Good peripheral circulation. Respiratory: Normal respiratory effort without tachypnea or retractions. Lungs CTAB. Good air entry to the bases with no decreased or absent breath sounds. Musculoskeletal: Full range of motion to all extremities. No gross deformities appreciated. Neurologic:  Normal speech and language. No gross focal neurologic deficits are appreciated.  Skin:  Skin is warm, dry. 1cm by 1cm area of erythema and fluctuance to  tailbone.  Psychiatric: Mood and affect are normal. Speech and behavior are normal. Patient exhibits appropriate insight and judgement.   ____________________________________________   LABS (all labs ordered are listed, but only abnormal results are displayed)  Labs Reviewed - No data to display ____________________________________________  EKG   ____________________________________________  RADIOLOGY  No results found.  ____________________________________________    PROCEDURES  Procedure(s) performed:    Procedures  INCISION AND DRAINAGE Performed by: Laban Emperor Consent: Verbal consent obtained. Risks and benefits: risks, benefits and alternatives were discussed Type: abscess  Anesthesia: local infiltration  Incision was made with a scalpel.  Local anesthetic: lidocaine 1 % without epinephrine  Anesthetic total: 3  ml  Complexity: complex Blunt dissection to break up loculations  Drainage: purulent  Drainage amount: moderate  Packing material: 1/4 in iodoform gauze  Patient tolerance: Patient tolerated the procedure well with no immediate complications.    Medications  lidocaine (PF) (XYLOCAINE) 1 % injection 5 mL (5 mLs Intradermal Given 10/10/19 2008)  acetaminophen (TYLENOL) tablet 650 mg (650 mg Oral Given 10/10/19 2008)  sulfamethoxazole-trimethoprim (BACTRIM DS) 800-160 MG per tablet 1 tablet (1 tablet Oral Given 10/10/19 2109)     ____________________________________________   INITIAL IMPRESSION / ASSESSMENT AND PLAN / ED COURSE  Pertinent labs & imaging results that were available during my care of the patient were reviewed by me and considered in my medical decision making (see chart for details).  Review of the [place] CSRS was performed in accordance of the [place] prior to dispensing any controlled drugs.   Patient's diagnosis is consistent with pilonidal abscess.  Abscess was successfully drained in the emergency department.  Patient  will be discharged home with prescriptions for Bactrim. Patient is to follow up with general surgery as directed.  Referral was given.  Patient is given ED precautions to return to the ED for any worsening or new symptoms.   Tiffany Sherman was evaluated in Emergency Department on 10/10/2019 for the symptoms described in the history of present illness. She was evaluated in the context of the global COVID-19 pandemic, which necessitated consideration that the patient might be at risk for infection with the SARS-CoV-2 virus that causes COVID-19. Institutional protocols and algorithms that pertain to the evaluation of patients at risk for COVID-19 are in a state of rapid change based on information released by regulatory bodies including the CDC and federal and state organizations. These policies and algorithms were followed during the patient's care in the ED.  ____________________________________________  FINAL CLINICAL IMPRESSION(S) / ED DIAGNOSES  Final diagnoses:  Pilonidal abscess      NEW MEDICATIONS STARTED DURING THIS VISIT:  ED Discharge Orders         Ordered    sulfamethoxazole-trimethoprim (BACTRIM DS) 800-160 MG tablet  2 times  daily     10/10/19 2007              This chart was dictated using voice recognition software/Dragon. Despite best efforts to proofread, errors can occur which can change the meaning. Any change was purely unintentional.    Enid Derry, PA-C 10/10/19 2218    Shaune Pollack, MD 10/11/19 [phone]

## 2019-10-10 NOTE — ED Notes (Signed)
First Nurse Note: Pt to ED via POV stating that she had an abscess on her back drained a few months ago. Pt states that the area has come back and she needs to be seen again. Pt is in NAD.

## 2019-10-19 ENCOUNTER — Ambulatory Visit: Payer: Self-pay | Admitting: General Surgery
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
## 2019-10-22 ENCOUNTER — Encounter
Admission: RE | Admit: 2019-10-22 | Discharge: 2019-10-22 | Disposition: A | Discharge status: Home / Self Care | Payer: Medicaid Other | Source: Ambulatory Visit | Attending: General Surgery | Admitting: General Surgery

## 2019-10-22 ENCOUNTER — Other Ambulatory Visit: Payer: Self-pay

## 2019-10-22 DIAGNOSIS — Z01818 Encounter for other preprocedural examination: Secondary | ICD-10-CM | POA: Diagnosis not present

## 2019-10-22 NOTE — Patient Instructions (Signed)
Your procedure is scheduled on: Friday 10/29/19.  Report to DAY SURGERY DEPARTMENT LOCATED ON 2ND FLOOR MEDICAL MALL ENTRANCE. To find out your arrival time please call [phone] between 1PM - 3PM on Thursday 10/28/19.   Remember: Instructions that are not followed completely may result in serious medical risk, up to and including death, or upon the discretion of your surgeon and anesthesiologist your surgery may need to be rescheduled.      _X__ 1. Do not eat food after midnight the night before your procedure.                 No gum chewing or hard candies. You may drink clear liquids up to 2 hours                 before you are scheduled to arrive for your surgery- DO NOT drink clear                 liquids within 2 hours of the start of your surgery.                 Clear Liquids include:  water, apple juice without pulp, clear carbohydrate                 drink such as Clearfast or Gatorade, Black Coffee or Tea (Do not add                 anything to coffee or tea).   __X__2.  On the morning of surgery brush your teeth with toothpaste and water, you may rinse your mouth with mouthwash if you wish.  Do not swallow any toothpaste or mouthwash.      _X__ 3.  No Alcohol for 24 hours before or after surgery.     _X__ 4.  Do Not Smoke or use e-cigarettes For 24 Hours Prior to Your Surgery.                 Do not use any chewable tobacco products for at least 6 hours prior to                 Surgery.    __X__5.  Notify your doctor if there is any change in your medical condition      (cold, fever, infections).       Do not wear jewelry, make-up, hairpins, clips or nail polish. Do not wear lotions, powders, or perfumes.  Do not shave 48 hours prior to surgery. Men may shave face and neck. Do not bring valuables to the hospital.      [facility] is not responsible for any belongings or valuables.    Contacts, dentures/partials or body piercings may not be worn into surgery.  Bring a case for your contacts, glasses or hearing aids, a denture cup will be supplied.     Patients discharged the day of surgery will not be allowed to drive home.   __X__ Take these medicines the morning of surgery with A SIP OF WATER:     1. NONE      __X__ Use CHG Soap/SAGE wipes as directed     __X__ Stop Anti-inflammatories 7 days before surgery such as Advil, Ibuprofen, Motrin, BC or Goodies Powder, Naprosyn, Naproxen, Aleve, Aspirin, Meloxicam. May take Tylenol if needed for pain or discomfort.     __X__ Don't start taking any new herbal supplements before your procedure.

## 2019-10-27 ENCOUNTER — Other Ambulatory Visit
Admission: RE | Admit: 2019-10-27 | Discharge: 2019-10-27 | Disposition: A | Discharge status: Home / Self Care | Payer: Medicaid Other | Source: Ambulatory Visit | Attending: General Surgery | Admitting: General Surgery

## 2019-10-27 DIAGNOSIS — Z20822 Contact with and (suspected) exposure to covid-19: Secondary | ICD-10-CM | POA: Insufficient documentation

## 2019-10-27 DIAGNOSIS — Z01812 Encounter for preprocedural laboratory examination: Secondary | ICD-10-CM | POA: Diagnosis present

## 2019-10-27 LAB — SARS CORONAVIRUS 2 (TAT 6-24 HRS): SARS Coronavirus 2: NEGATIVE

## 2019-10-29 ENCOUNTER — Encounter: Payer: Self-pay | Admitting: General Surgery

## 2019-10-29 ENCOUNTER — Ambulatory Visit: Payer: Medicaid Other | Admitting: Anesthesiology

## 2019-10-29 ENCOUNTER — Encounter
Admission: RE | Disposition: A | Discharge status: Home / Self Care | Payer: Self-pay | Source: Home / Self Care | Attending: General Surgery

## 2019-10-29 ENCOUNTER — Other Ambulatory Visit: Payer: Self-pay

## 2019-10-29 ENCOUNTER — Ambulatory Visit
Admission: RE | Admit: 2019-10-29 | Discharge: 2019-10-29 | Disposition: A | Discharge status: Home / Self Care | Payer: Medicaid Other | Attending: General Surgery | Admitting: General Surgery

## 2019-10-29 DIAGNOSIS — Z8489 Family history of other specified conditions: Secondary | ICD-10-CM | POA: Diagnosis not present

## 2019-10-29 DIAGNOSIS — Z91030 Bee allergy status: Secondary | ICD-10-CM | POA: Diagnosis not present

## 2019-10-29 DIAGNOSIS — Z8249 Family history of ischemic heart disease and other diseases of the circulatory system: Secondary | ICD-10-CM | POA: Diagnosis not present

## 2019-10-29 DIAGNOSIS — F329 Major depressive disorder, single episode, unspecified: Secondary | ICD-10-CM | POA: Diagnosis not present

## 2019-10-29 DIAGNOSIS — F509 Eating disorder, unspecified: Secondary | ICD-10-CM | POA: Insufficient documentation

## 2019-10-29 DIAGNOSIS — Z6827 Body mass index (BMI) 27.0-27.9, adult: Secondary | ICD-10-CM | POA: Insufficient documentation

## 2019-10-29 DIAGNOSIS — Z91040 Latex allergy status: Secondary | ICD-10-CM | POA: Insufficient documentation

## 2019-10-29 DIAGNOSIS — N858 Other specified noninflammatory disorders of uterus: Secondary | ICD-10-CM | POA: Insufficient documentation

## 2019-10-29 DIAGNOSIS — L0591 Pilonidal cyst without abscess: Secondary | ICD-10-CM | POA: Diagnosis present

## 2019-10-29 DIAGNOSIS — Z832 Family history of diseases of the blood and blood-forming organs and certain disorders involving the immune mechanism: Secondary | ICD-10-CM | POA: Diagnosis not present

## 2019-10-29 DIAGNOSIS — Z8379 Family history of other diseases of the digestive system: Secondary | ICD-10-CM | POA: Diagnosis not present

## 2019-10-29 DIAGNOSIS — F909 Attention-deficit hyperactivity disorder, unspecified type: Secondary | ICD-10-CM | POA: Diagnosis not present

## 2019-10-29 DIAGNOSIS — R519 Headache, unspecified: Secondary | ICD-10-CM | POA: Diagnosis not present

## 2019-10-29 DIAGNOSIS — Z808 Family history of malignant neoplasm of other organs or systems: Secondary | ICD-10-CM | POA: Diagnosis not present

## 2019-10-29 DIAGNOSIS — F17200 Nicotine dependence, unspecified, uncomplicated: Secondary | ICD-10-CM | POA: Insufficient documentation

## 2019-10-29 DIAGNOSIS — Z825 Family history of asthma and other chronic lower respiratory diseases: Secondary | ICD-10-CM | POA: Insufficient documentation

## 2019-10-29 DIAGNOSIS — Z888 Allergy status to other drugs, medicaments and biological substances status: Secondary | ICD-10-CM | POA: Diagnosis not present

## 2019-10-29 HISTORY — PX: PILONIDAL CYST EXCISION: SHX744

## 2019-10-29 LAB — URINE DRUG SCREEN, QUALITATIVE (ARMC ONLY)
Amphetamines, Ur Screen: NOT DETECTED
Barbiturates, Ur Screen: NOT DETECTED
Benzodiazepine, Ur Scrn: NOT DETECTED
Cannabinoid 50 Ng, Ur ~~LOC~~: POSITIVE — AB
Cocaine Metabolite,Ur ~~LOC~~: NOT DETECTED
MDMA (Ecstasy)Ur Screen: NOT DETECTED
Methadone Scn, Ur: NOT DETECTED
Opiate, Ur Screen: NOT DETECTED
Phencyclidine (PCP) Ur S: NOT DETECTED
Tricyclic, Ur Screen: NOT DETECTED

## 2019-10-29 LAB — POCT PREGNANCY, URINE: Preg Test, Ur: NEGATIVE

## 2019-10-29 SURGERY — EXCISION, SIMPLE PILONIDAL CYST
Anesthesia: General | Site: Back

## 2019-10-29 MED ORDER — FENTANYL CITRATE (PF) 100 MCG/2ML IJ SOLN
INTRAMUSCULAR | Status: DC | PRN
  Administered 2019-10-29 (×3): 50 ug via INTRAVENOUS

## 2019-10-29 MED ORDER — PROPOFOL 10 MG/ML IV BOLUS
INTRAVENOUS | Status: AC
  Filled 2019-10-29: qty 20

## 2019-10-29 MED ORDER — DEXAMETHASONE SODIUM PHOSPHATE 10 MG/ML IJ SOLN
INTRAMUSCULAR | Status: AC
  Filled 2019-10-29: qty 1

## 2019-10-29 MED ORDER — FENTANYL CITRATE (PF) 100 MCG/2ML IJ SOLN
INTRAMUSCULAR | Status: AC
  Filled 2019-10-29: qty 2

## 2019-10-29 MED ORDER — ACETAMINOPHEN 10 MG/ML IV SOLN
INTRAVENOUS | Status: AC
  Filled 2019-10-29: qty 100

## 2019-10-29 MED ORDER — ROCURONIUM BROMIDE 100 MG/10ML IV SOLN
INTRAVENOUS | Status: DC | PRN
  Administered 2019-10-29: 30 mg via INTRAVENOUS

## 2019-10-29 MED ORDER — ONDANSETRON HCL 4 MG/2ML IJ SOLN
INTRAMUSCULAR | Status: DC | PRN
  Administered 2019-10-29: 4 mg via INTRAVENOUS

## 2019-10-29 MED ORDER — CEFAZOLIN SODIUM-DEXTROSE 2-4 GM/100ML-% IV SOLN
INTRAVENOUS | Status: AC
  Filled 2019-10-29: qty 100

## 2019-10-29 MED ORDER — KETOROLAC TROMETHAMINE 30 MG/ML IJ SOLN
INTRAMUSCULAR | Status: AC
  Filled 2019-10-29: qty 1

## 2019-10-29 MED ORDER — ONDANSETRON HCL 4 MG/2ML IJ SOLN
INTRAMUSCULAR | Status: AC
  Filled 2019-10-29: qty 2

## 2019-10-29 MED ORDER — EPINEPHRINE PF 1 MG/ML IJ SOLN
INTRAMUSCULAR | Status: AC
  Filled 2019-10-29: qty 1

## 2019-10-29 MED ORDER — LIDOCAINE HCL (CARDIAC) PF 100 MG/5ML IV SOSY
PREFILLED_SYRINGE | INTRAVENOUS | Status: DC | PRN
  Administered 2019-10-29: 100 mg via INTRAVENOUS

## 2019-10-29 MED ORDER — ONDANSETRON HCL 4 MG/2ML IJ SOLN: 4.0000 mg | Freq: Once | INTRAMUSCULAR | Status: DC | PRN

## 2019-10-29 MED ORDER — KETOROLAC TROMETHAMINE 30 MG/ML IJ SOLN
INTRAMUSCULAR | Status: DC | PRN
  Administered 2019-10-29: 30 mg via INTRAVENOUS

## 2019-10-29 MED ORDER — DEXAMETHASONE SODIUM PHOSPHATE 10 MG/ML IJ SOLN
INTRAMUSCULAR | Status: DC | PRN
  Administered 2019-10-29: 10 mg via INTRAVENOUS

## 2019-10-29 MED ORDER — GLYCOPYRROLATE 0.2 MG/ML IJ SOLN
INTRAMUSCULAR | Status: DC | PRN
  Administered 2019-10-29: .2 mg via INTRAVENOUS

## 2019-10-29 MED ORDER — ACETAMINOPHEN 10 MG/ML IV SOLN
INTRAVENOUS | Status: DC | PRN
  Administered 2019-10-29: 1000 mg via INTRAVENOUS

## 2019-10-29 MED ORDER — FAMOTIDINE 20 MG PO TABS: 20.0000 mg | ORAL_TABLET | Freq: Once | ORAL | Status: AC

## 2019-10-29 MED ORDER — HYDROCODONE-ACETAMINOPHEN 5-325 MG PO TABS: 1.0000 | ORAL_TABLET | ORAL | 0 refills | Status: AC | PRN

## 2019-10-29 MED ORDER — SUGAMMADEX SODIUM 200 MG/2ML IV SOLN
INTRAVENOUS | Status: AC
  Filled 2019-10-29: qty 2

## 2019-10-29 MED ORDER — FAMOTIDINE 20 MG PO TABS
ORAL_TABLET | ORAL | Status: AC
  Administered 2019-10-29: 20 mg via ORAL
  Filled 2019-10-29: qty 1

## 2019-10-29 MED ORDER — LACTATED RINGERS IV SOLN: INTRAVENOUS | Status: DC

## 2019-10-29 MED ORDER — MIDAZOLAM HCL 2 MG/2ML IJ SOLN
INTRAMUSCULAR | Status: DC | PRN
  Administered 2019-10-29: 2 mg via INTRAVENOUS

## 2019-10-29 MED ORDER — BUPIVACAINE HCL (PF) 0.5 % IJ SOLN
INTRAMUSCULAR | Status: AC
  Filled 2019-10-29: qty 30

## 2019-10-29 MED ORDER — LIDOCAINE HCL (PF) 2 % IJ SOLN
INTRAMUSCULAR | Status: AC
  Filled 2019-10-29: qty 10

## 2019-10-29 MED ORDER — ROCURONIUM BROMIDE 50 MG/5ML IV SOLN
INTRAVENOUS | Status: AC
  Filled 2019-10-29: qty 1

## 2019-10-29 MED ORDER — SUGAMMADEX SODIUM 200 MG/2ML IV SOLN
INTRAVENOUS | Status: DC | PRN
  Administered 2019-10-29: 175 mg via INTRAVENOUS

## 2019-10-29 MED ORDER — PENTAFLUOROPROP-TETRAFLUOROETH EX AERO
INHALATION_SPRAY | CUTANEOUS | Status: AC
  Filled 2019-10-29: qty 116

## 2019-10-29 MED ORDER — FENTANYL CITRATE (PF) 100 MCG/2ML IJ SOLN: 25.0000 ug | INTRAMUSCULAR | Status: DC | PRN

## 2019-10-29 MED ORDER — BUPIVACAINE-EPINEPHRINE 0.5% -1:200000 IJ SOLN
INTRAMUSCULAR | Status: DC | PRN
  Administered 2019-10-29: 30 mL

## 2019-10-29 MED ORDER — CEFAZOLIN SODIUM-DEXTROSE 2-4 GM/100ML-% IV SOLN
2.0000 g | INTRAVENOUS | Status: AC
  Administered 2019-10-29: 2 g via INTRAVENOUS

## 2019-10-29 MED ORDER — PROPOFOL 10 MG/ML IV BOLUS
INTRAVENOUS | Status: DC | PRN
  Administered 2019-10-29: 150 mg via INTRAVENOUS

## 2019-10-29 MED ORDER — MIDAZOLAM HCL 2 MG/2ML IJ SOLN
INTRAMUSCULAR | Status: AC
  Filled 2019-10-29: qty 2

## 2019-10-29 SURGICAL SUPPLY — 28 items
BLADE CLIPPER SURG (BLADE) IMPLANT
BLADE SURG 15 STRL LF DISP TIS (BLADE) ×1 IMPLANT
BLADE SURG 15 STRL SS (BLADE) ×2
CANISTER SUCT 1200ML W/VALVE (MISCELLANEOUS) ×3 IMPLANT
COVER WAND RF STERILE (DRAPES) ×3 IMPLANT
DRAPE LAPAROTOMY 100X77 ABD (DRAPES) ×3 IMPLANT
ELECT REM PT RETURN 9FT ADLT (ELECTROSURGICAL) ×3
ELECTRODE REM PT RTRN 9FT ADLT (ELECTROSURGICAL) ×1 IMPLANT
GAUZE SPONGE 4X4 12PLY STRL (GAUZE/BANDAGES/DRESSINGS) ×3 IMPLANT
GLOVE BIO SURGEON STRL SZ 6.5 (GLOVE) ×2 IMPLANT
GLOVE BIO SURGEONS STRL SZ 6.5 (GLOVE) ×1
GLOVE BIOGEL PI IND STRL 6.5 (GLOVE) ×1 IMPLANT
GLOVE BIOGEL PI INDICATOR 6.5 (GLOVE) ×2
GOWN STRL REUS W/ TWL LRG LVL3 (GOWN DISPOSABLE) ×2 IMPLANT
GOWN STRL REUS W/TWL LRG LVL3 (GOWN DISPOSABLE) ×4
NEEDLE HYPO 25X1 1.5 SAFETY (NEEDLE) ×3 IMPLANT
NS IRRIG 500ML POUR BTL (IV SOLUTION) ×3 IMPLANT
PACK BASIN MINOR ARMC (MISCELLANEOUS) ×3 IMPLANT
SOL PREP PVP 2OZ (MISCELLANEOUS) ×3
SOLUTION PREP PVP 2OZ (MISCELLANEOUS) ×1 IMPLANT
SUT ETHILON 2 0 FS 18 (SUTURE) ×3 IMPLANT
SUT VIC AB 2-0 CT1 (SUTURE) ×3 IMPLANT
SUT VIC AB 2-0 CT1 27 (SUTURE) ×2
SUT VIC AB 2-0 CT1 TAPERPNT 27 (SUTURE) ×1 IMPLANT
SWABSTK COMLB BENZOIN TINCTURE (MISCELLANEOUS) ×3 IMPLANT
SYR 10ML LL (SYRINGE) ×3 IMPLANT
SYR BULB IRRIG 60ML STRL (SYRINGE) ×3 IMPLANT
TAPE CLOTH 3X10 WHT NS LF (GAUZE/BANDAGES/DRESSINGS) ×3 IMPLANT

## 2019-10-29 NOTE — Anesthesia Preprocedure Evaluation (Addendum)
Anesthesia Evaluation  Patient identified by MRN, date of birth, ID band Patient awake    Reviewed: Allergy & Precautions, NPO status , Patient's Chart, lab work & pertinent test results  History of Anesthesia Complications Negative for: history of anesthetic complications  Airway Mallampati: II       Dental   Pulmonary neg sleep apnea, neg COPD, Current Smoker,           Cardiovascular (-) hypertension(-) Past MI and (-) CHF (-) dysrhythmias (-) Valvular Problems/Murmurs     Neuro/Psych neg Seizures    GI/Hepatic Neg liver ROS, neg GERD  ,  Endo/Other  neg diabetes  Renal/GU negative Renal ROS     Musculoskeletal   Abdominal   Peds  Hematology   Anesthesia Other Findings   Reproductive/Obstetrics                            Anesthesia Physical Anesthesia Plan  ASA: II  Anesthesia Plan: General   Post-op Pain Management:    Induction: Intravenous  PONV Risk Score and Plan: 2 and Ondansetron and Dexamethasone  Airway Management Planned: Oral ETT  Additional Equipment:   Intra-op Plan:   Post-operative Plan:   Informed Consent: I have reviewed the patients History and Physical, chart, labs and discussed the procedure including the risks, benefits and alternatives for the proposed anesthesia with the patient or authorized representative who has indicated his/her understanding and acceptance.       Plan Discussed with:   Anesthesia Plan Comments:         Anesthesia Quick Evaluation  

## 2019-10-29 NOTE — Transfer of Care (Signed)
Immediate Anesthesia Transfer of Care Note  Patient: Tiffany Sherman  Procedure(s) Performed: Procedure(s): CYST EXCISION PILONIDAL SIMPLE (N/A)  Patient Location: PACU  Anesthesia Type:General  Level of Consciousness: sedated  Airway & Oxygen Therapy: Patient Spontanous Breathing and Patient connected to face mask oxygen  Post-op Assessment: Report given to RN and Post -op Vital signs reviewed and stable  Post vital signs: Reviewed and stable  Last Vitals:  Vitals:   10/29/19 0911 10/29/19 1116  BP: 112/76 130/73  Pulse: 100 100  Resp: 16 15  Temp: 36.7 C (!) 36.2 C  SpO2: 98% 100%    Complications: No apparent anesthesia complications

## 2019-10-29 NOTE — Discharge Instructions (Signed)
  Diet: Resume home heart healthy regular diet.   Activity: Increase activity as tolerated. Light activity and walking are encouraged. Do not drive or drink alcohol if taking narcotic pain medications.  Wound care: Remove dressing before next shower. May shower with soapy water and pat dry (do not rub incisions), but no baths or submerging incision underwater until follow-up. (no swimming)   Will remove stitches on next appointment.   Medications: Resume all home medications. For mild to moderate pain: acetaminophen (Tylenol) or ibuprofen (if no kidney disease). Combining Tylenol with alcohol can substantially increase your risk of causing liver disease. Narcotic pain medications, if prescribed, can be used for severe pain, though may cause nausea, constipation, and drowsiness. Do not combine Tylenol and Norco within a 6 hour period as Norco contains Tylenol. If you do not need the narcotic pain medication, you do not need to fill the prescription.  Call office [phone]) at any time if any questions, worsening pain, fevers/chills, bleeding, drainage from incision site, or other concerns.

## 2019-10-29 NOTE — Op Note (Signed)
Preoperative diagnosis: Chronic pilonidal cyst  Postoperative diagnosis: Chronic pilonidal cyst.  Procedure: Excision and primary closure of pilonidal cyst.   Anesthesia: General  Surgeon: Dr. Jerene Dilling, MD  Indications: Patient is a 24 y.o. female who has chronic pilonidal cyst of months duration requiring incision and drainage limited to the midline and currently without evidence of infection. Excision and primary closure was elected for management.  Findings:  1. Upper left area of induration. 2. No abscess  Description of procedure: The patient was brought to the operating room and underwent general anesthesia and endotracheal intubation. All appropriate monitoring devices were in place. The patient was then placed in the prone jackknife position. All pressure points were padded, and then the presacral region was prepped and draped in the usual sterile fashion. A time-out was completed verifying correct patient, procedure, site, positioning, and implant(s) and/or special equipment prior to beginning this procedure. Skin marking the external disease margins and the "safe zone" of deep dissection on the gluteal cleft was done.  A probe was inserted and the midline tract readily identified. An elliptical incision was made around the openings and the entire tract. This was deepened through subcutaneous tissue using electrocautery. The incision was continued until normal tissue deep to the tract was encountered. The pilonidal sinus tract, granulation tissue and debri was thus excised cleanly in its entirety.  Hemostasis was achieved with electrocautery. After ensuring that there is no infection and that the wound was clean, flaps were developed until the skin and subcutaneous tissues could be approximated left to the midline without tension. The incision was then closed in layers with interrupted sutures of 2-0 vicryl, placed in such a fashion as to completely close the dead space. The skin  closed with 3 interrupted 2-0 Prolene sutures.  The wound was dressed.  The patient tolerated the procedure well, was extubated and reversed from general anesthesia. Then, the patient was taken to the postanesthesia care unit in stable condition.   Specimen: Pilonidal cyst  Complications: None  EBL: 5 mL

## 2019-10-29 NOTE — Interval H&P Note (Signed)
History and Physical Interval Note:  10/29/2019 9:57 AM  Tiffany Sherman  has presented today for surgery, with the diagnosis of L05.91 Pilonidal cyst w/o abscess.  The various methods of treatment have been discussed with the patient and family. After consideration of risks, benefits and other options for treatment, the patient has consented to  Procedure(s): CYST EXCISION PILONIDAL SIMPLE (N/A) as a surgical intervention.  The patient's history has been reviewed, patient examined, no change in status, stable for surgery.  I have reviewed the patient's chart and labs.  Questions were answered to the patient's satisfaction.     Carolan Shiver

## 2019-10-29 NOTE — Anesthesia Procedure Notes (Signed)
Procedure Name: Intubation Date/Time: 10/29/2019 10:25 AM Performed by: Doreen Salvage, CRNA Pre-anesthesia Checklist: Patient identified, Patient being monitored, Timeout performed, Emergency Drugs available and Suction available Patient Re-evaluated:Patient Re-evaluated prior to induction Oxygen Delivery Method: Circle system utilized Preoxygenation: Pre-oxygenation with 100% oxygen Induction Type: IV induction Ventilation: Mask ventilation without difficulty Laryngoscope Size: Mac and 3 Grade View: Grade I Tube type: Oral Tube size: 7.0 mm Number of attempts: 1 Airway Equipment and Method: Stylet Placement Confirmation: ETT inserted through vocal cords under direct vision,  positive ETCO2 and breath sounds checked- equal and bilateral Secured at: 21 cm Tube secured with: Tape Dental Injury: Teeth and Oropharynx as per pre-operative assessment

## 2019-11-01 LAB — SURGICAL PATHOLOGY

## 2019-11-10 NOTE — Anesthesia Postprocedure Evaluation (Signed)
Anesthesia Post Note  Patient: Tiffany Sherman  Procedure(s) Performed: CYST EXCISION PILONIDAL SIMPLE (N/A Back)  Patient location during evaluation: PACU Anesthesia Type: General Level of consciousness: awake and alert Pain management: pain level controlled Vital Signs Assessment: post-procedure vital signs reviewed and stable Respiratory status: spontaneous breathing and respiratory function stable Cardiovascular status: stable Anesthetic complications: no     Last Vitals:  Vitals:   10/29/19 1202 10/29/19 1212  BP: 108/70 108/67  Pulse: 79 60  Resp: 13 18  Temp:  (!) 36.2 C  SpO2: 100% 100%    Last Pain:  Vitals:   10/29/19 1212  TempSrc:   PainSc: 0-No pain                 [name]

## 2020-02-16 ENCOUNTER — Other Ambulatory Visit: Payer: Self-pay

## 2020-02-16 ENCOUNTER — Emergency Department
Admission: EM | Admit: 2020-02-16 | Discharge: 2020-02-16 | Disposition: A | Discharge status: Home / Self Care | Payer: Medicaid Other | Attending: Emergency Medicine | Admitting: Emergency Medicine

## 2020-02-16 ENCOUNTER — Emergency Department: Payer: Medicaid Other

## 2020-02-16 DIAGNOSIS — F17210 Nicotine dependence, cigarettes, uncomplicated: Secondary | ICD-10-CM | POA: Insufficient documentation

## 2020-02-16 DIAGNOSIS — J069 Acute upper respiratory infection, unspecified: Secondary | ICD-10-CM | POA: Diagnosis not present

## 2020-02-16 DIAGNOSIS — R05 Cough: Secondary | ICD-10-CM | POA: Diagnosis present

## 2020-02-16 DIAGNOSIS — Z20822 Contact with and (suspected) exposure to covid-19: Secondary | ICD-10-CM | POA: Insufficient documentation

## 2020-02-16 LAB — SARS CORONAVIRUS 2 BY RT PCR (HOSPITAL ORDER, PERFORMED IN ~~LOC~~ HOSPITAL LAB): SARS Coronavirus 2: NEGATIVE

## 2020-02-16 LAB — GROUP A STREP BY PCR: Group A Strep by PCR: NOT DETECTED

## 2020-02-16 MED ORDER — PSEUDOEPH-BROMPHEN-DM 30-2-10 MG/5ML PO SYRP: 5.0000 mL | ORAL_SOLUTION | Freq: Four times a day (QID) | ORAL | 0 refills | Status: DC | PRN

## 2020-02-16 MED ORDER — ACETAMINOPHEN 500 MG PO TABS
1000.0000 mg | ORAL_TABLET | Freq: Once | ORAL | Status: AC
  Administered 2020-02-16: 1000 mg via ORAL
  Filled 2020-02-16: qty 2

## 2020-02-16 NOTE — Discharge Instructions (Addendum)
Follow discharge care instruction take medication as directed.  Self quarantine pending results of COVID-19 test.  If test is positive must quarantine additional 10 days.  You may follow the results of COVID-19 test in the MyChart app.  You will be notified telephonically if test is positive.

## 2020-02-16 NOTE — ED Notes (Signed)
See triage note  Presents with cough,sore throatt and nasal congestion   States her sxs' started yesterday  Afebrile on arrival

## 2020-02-16 NOTE — ED Provider Notes (Signed)
[facility] Emergency Department Provider Note   ____________________________________________   First MD Initiated Contact with Patient 02/16/20 1136     (approximate)  I have reviewed the triage vital signs and the nursing notes.   HISTORY  Chief Complaint Cough    HPI Tiffany Sherman is a 24 y.o. female patient presents with nonproductive cough, sore throat, and nasal congestion.  Patient state complaint started last night.  Patient has son had a similar complaint which started 3 days ago.  Denies recent travel or known contact with COVID-19.  Denies nausea, vomiting, diarrhea.         Past Medical History:  Diagnosis Date  . Broken foot   . Headache     Patient Active Problem List   Diagnosis Date Noted  . Post-term pregnancy, 40-42 weeks of gestation 11/12/2018  . Decreased fetal movement affecting management of mother, antepartum 10/26/2018  . Gastroesophageal reflux 04/04/2013  . Pregnancy 04/04/2013    Past Surgical History:  Procedure Laterality Date  . PILONIDAL CYST EXCISION N/A 10/29/2019   Procedure: CYST EXCISION PILONIDAL SIMPLE;  Surgeon: Carolan Shiver, MD;  Location: [facility] ORS;  Service: General;  Laterality: N/A;  . VAGINAL WOUND CLOSURE / REPAIR     Per pt 80 stitiches  . WISDOM TOOTH EXTRACTION      Prior to Admission medications   Medication Sig Start Date End Date Taking? Authorizing Provider  brompheniramine-pseudoephedrine-DM 30-2-10 MG/5ML syrup Take 5 mLs by mouth 4 (four) times daily as needed. 02/16/20   Joni Reining, PA-C    Allergies Bee venom, Calamine, and Latex  Family History  Problem Relation Age of Onset  . Heart disease Mother   . Anemia Mother   . Hypertension Mother   . Obesity Mother   . Cancer Father   . Heart disease Father   . Kidney disease Father   . Liver disease Father   . Hepatitis C Father   . Skin cancer Father   . Bipolar disorder Father   . Sleep apnea Paternal  Grandmother     Social History Social History   Tobacco Use  . Smoking status: Current Every Day Smoker    Packs/day: 0.50    Types: Cigarettes  . Smokeless tobacco: Never Used  Substance Use Topics  . Alcohol use: No  . Drug use: Yes    Types: Marijuana    Review of Systems Constitutional: No fever/chills Eyes: No visual changes. ENT: No sore throat. Cardiovascular: Denies chest pain. Respiratory: Denies shortness of breath. Gastrointestinal: No abdominal pain.  No nausea, no vomiting.  No diarrhea.  No constipation. Genitourinary: Negative for dysuria. Musculoskeletal: Negative for back pain. Skin: Negative for rash. Neurological: Negative for headaches, focal weakness or numbness. Allergic/Immunilogical: Bee sting, calamine lotion, and latex. ____________________________________________   PHYSICAL EXAM:  VITAL SIGNS: ED Triage Vitals  Enc Vitals Group     BP 02/16/20 1120 131/73     Pulse Rate 02/16/20 1120 83     Resp 02/16/20 1120 18     Temp 02/16/20 1120 98.4 F (36.9 C)     Temp src --      SpO2 02/16/20 1120 96 %     Weight 02/16/20 1122 180 lb (81.6 kg)     Height 02/16/20 1122 5\' 8"  (1.727 m)     Head Circumference --      Peak Flow --      Pain Score 02/16/20 1122 5     Pain Loc --  Pain Edu? --      Excl. in [place]? --    Constitutional: Alert and oriented. Well appearing and in no acute distress. Nose: Clear rhinorrhea  mouth/Throat: Mucous membranes are moist.  Oropharynx non-erythematous.  Postnasal drainage Neck: No stridor.   Hematological/Lymphatic/Immunilogical: No cervical lymphadenopathy. Cardiovascular: Normal rate, regular rhythm. Grossly normal heart sounds.  Good peripheral circulation. Respiratory: Normal respiratory effort.  No retractions. Lungs CTAB.  Nonproductive cough. Gastrointestinal: Soft and nontender. No distention. No abdominal bruits. No CVA tenderness. Skin:  Skin is warm, dry and intact. No rash  noted. Psychiatric: Mood and affect are normal. Speech and behavior are normal.  ____________________________________________   LABS (all labs ordered are listed, but only abnormal results are displayed)  Labs Reviewed  GROUP A STREP BY PCR  SARS CORONAVIRUS 2 BY RT PCR (HOSPITAL ORDER, [facility])   ____________________________________________  EKG   ____________________________________________  RADIOLOGY  ED MD interpretation:    Official radiology report(s): DG Chest Portable 1 View  Result Date: 02/16/2020 CLINICAL DATA:  Patient with productive cough. EXAM: PORTABLE CHEST 1 VIEW COMPARISON:  Chest radiograph 02/22/2015 FINDINGS: Normal cardiac and mediastinal contours. No consolidative pulmonary opacities. No pleural effusion or pneumothorax. Osseous structures unremarkable. IMPRESSION: No acute cardiopulmonary process. Electronically Signed   By: Lovey Newcomer M.D.   On: 02/16/2020 12:40    ____________________________________________   PROCEDURES  Procedure(s) performed (including Critical Care):  Procedures   ____________________________________________   INITIAL IMPRESSION / ASSESSMENT AND PLAN / ED COURSE  As part of my medical decision making, I reviewed the following data within the [place]     Patient presents with cough, sore throat, nasal congestion that began last night.  Patient was a concern for COVID-19 but no confirmed exposure.  Discussed negative chest x-ray and rapid strep test results with patient.  Advised to self quarantine pending results of COVID-19 test.  If test is positive was quarantine additional 10 days.  Take medication as directed.   Tiffany Sherman was evaluated in Emergency Department on 02/16/2020 for the symptoms described in the history of present illness. She was evaluated in the context of the global COVID-19 pandemic, which necessitated consideration that the patient might be at  risk for infection with the SARS-CoV-2 virus that causes COVID-19. Institutional protocols and algorithms that pertain to the evaluation of patients at risk for COVID-19 are in a state of rapid change based on information released by regulatory bodies including the CDC and federal and state organizations. These policies and algorithms were followed during the patient's care in the ED.       ____________________________________________   FINAL CLINICAL IMPRESSION(S) / ED DIAGNOSES  Final diagnoses:  Viral URI with cough     ED Discharge Orders         Ordered    brompheniramine-pseudoephedrine-DM 30-2-10 MG/5ML syrup  4 times daily PRN     02/16/20 1341           Note:  This document was prepared using Dragon voice recognition software and may include unintentional dictation errors.    Sable Feil, PA-C 02/16/20 1344    Carrie Mew, MD 02/16/20 [phone]

## 2020-02-16 NOTE — ED Triage Notes (Signed)
Pt comes via POV from home with c/o cough, sore throat and nasal congestin. Pt states this started last night.

## 2020-03-01 DIAGNOSIS — B009 Herpesviral infection, unspecified: Secondary | ICD-10-CM

## 2020-03-01 DIAGNOSIS — Z91410 Personal history of adult physical and sexual abuse: Secondary | ICD-10-CM

## 2020-03-02 ENCOUNTER — Ambulatory Visit: Payer: Self-pay

## 2020-03-02 ENCOUNTER — Ambulatory Visit (LOCAL_COMMUNITY_HEALTH_CENTER): Payer: Medicaid Other | Admitting: Advanced Practice Midwife

## 2020-03-02 ENCOUNTER — Encounter: Payer: Self-pay | Admitting: Advanced Practice Midwife

## 2020-03-02 ENCOUNTER — Other Ambulatory Visit: Payer: Self-pay

## 2020-03-02 ENCOUNTER — Ambulatory Visit: Payer: Medicaid Other

## 2020-03-02 VITALS — BP 128/75 | Ht 67.0 in | Wt 177.0 lb

## 2020-03-02 DIAGNOSIS — T7411XA Adult physical abuse, confirmed, initial encounter: Secondary | ICD-10-CM | POA: Insufficient documentation

## 2020-03-02 DIAGNOSIS — F909 Attention-deficit hyperactivity disorder, unspecified type: Secondary | ICD-10-CM | POA: Insufficient documentation

## 2020-03-02 DIAGNOSIS — Z3049 Encounter for surveillance of other contraceptives: Secondary | ICD-10-CM

## 2020-03-02 DIAGNOSIS — F32A Depression, unspecified: Secondary | ICD-10-CM | POA: Insufficient documentation

## 2020-03-02 DIAGNOSIS — F1290 Cannabis use, unspecified, uncomplicated: Secondary | ICD-10-CM

## 2020-03-02 DIAGNOSIS — Z30011 Encounter for initial prescription of contraceptive pills: Secondary | ICD-10-CM | POA: Diagnosis not present

## 2020-03-02 DIAGNOSIS — Z3009 Encounter for other general counseling and advice on contraception: Secondary | ICD-10-CM | POA: Diagnosis not present

## 2020-03-02 DIAGNOSIS — L0591 Pilonidal cyst without abscess: Secondary | ICD-10-CM | POA: Insufficient documentation

## 2020-03-02 DIAGNOSIS — E663 Overweight: Secondary | ICD-10-CM | POA: Insufficient documentation

## 2020-03-02 DIAGNOSIS — F17200 Nicotine dependence, unspecified, uncomplicated: Secondary | ICD-10-CM | POA: Insufficient documentation

## 2020-03-02 DIAGNOSIS — Z30012 Encounter for prescription of emergency contraception: Secondary | ICD-10-CM

## 2020-03-02 DIAGNOSIS — R630 Anorexia: Secondary | ICD-10-CM

## 2020-03-02 DIAGNOSIS — T7411XS Adult physical abuse, confirmed, sequela: Secondary | ICD-10-CM

## 2020-03-02 HISTORY — DX: Attention-deficit hyperactivity disorder, unspecified type: F90.9

## 2020-03-02 MED ORDER — NORGESTIM-ETH ESTRAD TRIPHASIC 0.18/0.215/0.25 MG-35 MCG PO TABS: ORAL_TABLET | ORAL | 12 refills | Status: AC

## 2020-03-02 MED ORDER — LEVONORGESTREL 1.5 MG PO TABS: 1.5000 mg | ORAL_TABLET | Freq: Once | ORAL | Status: DC

## 2020-03-02 MED ORDER — LEVONORGESTREL 1.5 MG PO TABS: 1.5000 mg | ORAL_TABLET | Freq: Once | ORAL | 0 refills | Status: AC
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
## 2020-03-02 NOTE — Progress Notes (Signed)
Wet [place] results reviewed. Per standing orders no treatment indicated. Lasonja Lakins, RN  

## 2020-03-02 NOTE — Progress Notes (Addendum)
Here today for PE and Nexplanon removal. Last PE and Pap here was 04/20/2018. States Nexplanon was inserted at [facility] "last year." Wants all STD screening today. Wants OCP's for alternate birth control. Tawny Hopping, RN

## 2020-03-07 LAB — WET PREP FOR TRICH, YEAST, CLUE
Trichomonas Exam: NEGATIVE
Yeast Exam: NEGATIVE

## 2020-03-07 NOTE — Addendum Note (Signed)
Addended by: Tawny Hopping A on: 03/07/2020 10:07 AM   Modules accepted: Orders

## 2020-03-22 ENCOUNTER — Encounter: Payer: Self-pay | Admitting: Advanced Practice Midwife

## 2020-03-22 DIAGNOSIS — Z113 Encounter for screening for infections with a predominantly sexual mode of transmission: Secondary | ICD-10-CM

## 2020-05-01 ENCOUNTER — Ambulatory Visit (LOCAL_COMMUNITY_HEALTH_CENTER): Payer: Medicaid Other | Admitting: Family Medicine

## 2020-05-01 ENCOUNTER — Other Ambulatory Visit: Payer: Self-pay

## 2020-05-01 ENCOUNTER — Encounter: Payer: Self-pay | Admitting: Family Medicine

## 2020-05-01 VITALS — BP 127/87 | Ht 67.0 in | Wt 167.2 lb

## 2020-05-01 DIAGNOSIS — Z30013 Encounter for initial prescription of injectable contraceptive: Secondary | ICD-10-CM

## 2020-05-01 DIAGNOSIS — Z113 Encounter for screening for infections with a predominantly sexual mode of transmission: Secondary | ICD-10-CM

## 2020-05-01 DIAGNOSIS — Z7251 High risk heterosexual behavior: Secondary | ICD-10-CM

## 2020-05-01 DIAGNOSIS — Z3009 Encounter for other general counseling and advice on contraception: Secondary | ICD-10-CM

## 2020-05-01 MED ORDER — ELLA 30 MG PO TABS: 1.0000 | ORAL_TABLET | Freq: Once | ORAL | 0 refills | Status: AC

## 2020-05-01 MED ORDER — METRONIDAZOLE 500 MG PO TABS: 500.0000 mg | ORAL_TABLET | Freq: Two times a day (BID) | ORAL | 0 refills | Status: DC

## 2020-05-01 MED ORDER — MEDROXYPROGESTERONE ACETATE 150 MG/ML IM SUSP
150.0000 mg | INTRAMUSCULAR | Status: AC
  Administered 2020-05-01 – 2021-02-22 (×3): 150 mg via INTRAMUSCULAR

## 2020-05-01 NOTE — Progress Notes (Signed)
Contraception/Family Planning VISIT ENCOUNTER NOTE  Subjective:   Tiffany Sherman is a 24 y.o. [phone] female here for reproductive life counseling.  Desires non-daily from [facility].  Reports she does not want a pregnancy in the next year. Denies abnormal vaginal bleeding, discharge, pelvic pain, problems with intercourse or other gynecologic concerns.    Gynecologic History Patient's last menstrual period was 04/28/2020 (exact date). Contraception: OCP (estrogen/progesterone)- missing pills  Health Maintenance Due  Topic Date Due  . Hepatitis C Screening  Never done  . COVID-19 Vaccine (1) Never done  . TETANUS/TDAP  Never done  . INFLUENZA VACCINE  04/23/2020     The following portions of the patient's history were reviewed and updated as appropriate: allergies, current medications, past family history, past medical history, past social history, past surgical history and problem list.  Review of Systems Pertinent items are noted in HPI.   Objective:  BP 127/87   Ht 5\' 7"  (1.702 m)   Wt 167 lb 3.2 oz (75.8 kg)   LMP 04/28/2020 (Exact Date)   BMI 26.19 kg/m  Gen: well appearing, NAD HEENT: no scleral icterus CV: RR Lung: Normal WOB Ext: warm well perfused  PELVIC: Normal appearing external genitalia; normal appearing vaginal mucosa and cervix.  Yellow-gray discharge, pH> 4.5 and + strong odor. Normal uterine size, no other palpable masses, no uterine or adnexal tenderness.   Assessment and Plan:   Contraception counseling: Reviewed all forms of birth control options in the tiered based approach. available including abstinence; over the counter/barrier methods; hormonal contraceptive medication including pill, patch, ring, injection,contraceptive implant, ECP; hormonal and nonhormonal IUDs; permanent sterilization options including vasectomy and the various tubal sterilization modalities. Risks, benefits, and typical effectiveness rates were reviewed.  Questions were answered.   Written information was also given to the patient to review.  Patient desires Depo, this was prescribed for patient. She will follow up in  3 month for surveillance.  She was told to call with any further questions, or with any concerns about this method of contraception.  Emphasized use of condoms 100% of the time for STI prevention.  Patient was offered ECP. ECP was accepted by the patient. ECP counseling was given - see RN documentation  1. Family planning - Patient counseled on Ella decreasing effectiveness of depo but strongly desires depo today, she reports she will abstain from sex for 2 weeks after depo and using 06/28/2020.  Jordan sent to pharmacy today, recommended to return if unable to get. Reviewed effectiveness for up to 120 hr - Pregnancy, urine - WET PREP FOR TRICH, YEAST, CLUE - ulipristal acetate (ELLA) 30 MG tablet; Take 1 tablet (30 mg total) by mouth once for 1 dose.  Dispense: 1 tablet; Refill: 0 - medroxyPROGESTERone (DEPO-PROVERA) injection 150 mg-- NEEDS urine pregnancy test at next depo  2. Unprotected sex - Pregnancy, urine - ulipristal acetate (ELLA) 30 MG tablet; Take 1 tablet (30 mg total) by mouth once for 1 dose.  Dispense: 1 tablet; Refill: 0 - Chlamydia/Gonorrhea [place] [facility] - Syphilis Serology, [place] [facility] - HIV/HCV [place] [facility]  3. Encounter for initial prescription of injectable contraceptive - ulipristal acetate (ELLA) 30 MG tablet; Take 1 tablet (30 mg total) by mouth once for 1 dose.  Dispense: 1 tablet; Refill: 0 - medroxyPROGESTERone (DEPO-PROVERA) injection 150 mg - Ambulatory referral to Behavioral Health  4. Screening examination for venereal disease - treat wet mount per standing - Wet mount - Chlamydia/Gonorrhea [place] [facility] -  Syphilis Serology, [place] [facility] - HIV/HCV [place] [facility]  5. Mood sx Declined referral to [facility] today  Depression screen [facility] 2/9 05/01/2020 03/02/2020  Decreased Interest 2 0  Down, Depressed, Hopeless 1 0  PHQ - 2 Score  3 0  Altered sleeping 1 -  Tired, decreased energy 1 -  Change in appetite 1 -  Feeling bad or failure about yourself  0 -  Trouble concentrating 1 -  Moving slowly or fidgety/restless 0 -  Suicidal thoughts 0 -  PHQ-9 Score 7 -     Please refer to After Visit Summary for other counseling recommendations.   Return in about 3 months (around 08/01/2020) for Depo, urine pregnancy at depo visit.  Federico Flake, MD [facility] DEPARTMENT
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
## 2020-05-02 LAB — WET PREP FOR TRICH, YEAST, CLUE
Trichomonas Exam: NEGATIVE
Yeast Exam: NEGATIVE

## 2020-05-02 LAB — PREGNANCY, URINE: Preg Test, Ur: NEGATIVE

## 2020-05-05 ENCOUNTER — Encounter: Payer: Self-pay | Admitting: Family Medicine

## 2020-05-05 LAB — HM HEPATITIS C SCREENING LAB: HM Hepatitis Screen: NEGATIVE

## 2020-05-05 LAB — HM HIV SCREENING LAB: HM HIV Screening: NEGATIVE

## 2020-05-09 ENCOUNTER — Telehealth: Payer: Self-pay | Admitting: Family Medicine

## 2020-05-09 ENCOUNTER — Encounter: Payer: Self-pay | Admitting: Family Medicine

## 2020-05-09 NOTE — Telephone Encounter (Signed)
Call to patient to discuss positive TR.  Patient verified by password.  Patient informed of + GC result.  Patient schedueld for treatment appointment.  Patient verbalizes understanding at this time and has no further questions. Wendi Snipes, RN

## 2020-05-10 ENCOUNTER — Ambulatory Visit: Payer: Medicaid Other

## 2020-05-10 ENCOUNTER — Other Ambulatory Visit: Payer: Self-pay

## 2020-05-10 DIAGNOSIS — A549 Gonococcal infection, unspecified: Secondary | ICD-10-CM

## 2020-05-10 DIAGNOSIS — Z113 Encounter for screening for infections with a predominantly sexual mode of transmission: Secondary | ICD-10-CM | POA: Diagnosis not present

## 2020-05-10 MED ORDER — CEFTRIAXONE SODIUM 250 MG IJ SOLR
500.0000 mg | Freq: Once | INTRAMUSCULAR | Status: AC
  Administered 2020-05-10: 500 mg via INTRAMUSCULAR

## 2020-05-11 ENCOUNTER — Telehealth: Payer: Self-pay | Admitting: Family Medicine

## 2020-05-11 NOTE — Telephone Encounter (Signed)
Phone call to pt. Pt requesting dr note about appt from yesterday's visit. Counseled pt that the note would indicate the day and time frames she was here. Pt to arrive at [facility] today to pick up note.

## 2020-05-11 NOTE — Telephone Encounter (Signed)
Patient wants to talk to nurse about getting a work note for her visit of 05/10/20

## 2020-08-14 ENCOUNTER — Other Ambulatory Visit: Payer: Self-pay

## 2020-08-14 ENCOUNTER — Ambulatory Visit (LOCAL_COMMUNITY_HEALTH_CENTER): Payer: Medicaid Other | Admitting: Physician Assistant

## 2020-08-14 ENCOUNTER — Encounter: Payer: Self-pay | Admitting: Physician Assistant

## 2020-08-14 VITALS — BP 99/63 | Ht 67.0 in | Wt 149.4 lb

## 2020-08-14 DIAGNOSIS — Z113 Encounter for screening for infections with a predominantly sexual mode of transmission: Secondary | ICD-10-CM

## 2020-08-14 DIAGNOSIS — Z3042 Encounter for surveillance of injectable contraceptive: Secondary | ICD-10-CM

## 2020-08-14 DIAGNOSIS — Z3009 Encounter for other general counseling and advice on contraception: Secondary | ICD-10-CM | POA: Diagnosis not present

## 2020-08-14 LAB — WET PREP FOR TRICH, YEAST, CLUE
Trichomonas Exam: NEGATIVE
Yeast Exam: NEGATIVE

## 2020-08-14 LAB — PREGNANCY, URINE: Preg Test, Ur: NEGATIVE

## 2020-08-14 NOTE — Progress Notes (Signed)
UPT is negative today. Wet mount reviewed and no treatment needed for wet mount per standing order. Pt received Depo 150mg  IM today per , PA order and per Sadie Haber, MD order from 05/01/2020. Pt tolerated Depo well. Provider orders completed.

## 2020-08-14 NOTE — Progress Notes (Signed)
Pt is here to get back on her Depo. Pt's last Depo was 05/01/2020, so pt is 15 weeks post last Depo. Pt reports last period was over 30 days ago. Pt reports using condoms sometimes. Pt reports she thinks the last time she had sex without a condom was ~3 nights ago, but is unsure. Pt desires STI screening today as well.

## 2020-08-15 NOTE — Progress Notes (Signed)
   [facility] problem visit  [facility]  Subjective:  Tiffany Sherman is a 24 y.o. being seen today for Depo and STD screening.  Chief Complaint  Patient presents with  . Contraception    want to get back on Depo    HPI  Patient states that she is not having any vaginal symptoms but would like a STD screening today.  Reports that she was told by her PCP that her Depo had expired on 07/20/2020, and she requests pregnancy test prior to getting her Depo today.  Prefers to get Depo in her hip.   Does the patient have a current or past history of drug use? No   No components found for: HCV]   Health Maintenance Due  Topic Date Due  . COVID-19 Vaccine (1) Never done  . TETANUS/TDAP  Never done  . INFLUENZA VACCINE  04/23/2020    Review of Systems  All other systems reviewed and are negative.   The following portions of the patient's history were reviewed and updated as appropriate: allergies, current medications, past family history, past medical history, past social history, past surgical history and problem list. Problem list updated.   See flowsheet for other program required questions.  Objective:   Vitals:   08/14/20 1516  BP: 99/63  Weight: 149 lb 6.4 oz (67.8 kg)  Height: 5\' 7"  (1.702 m)    Physical Exam Vitals and nursing note reviewed.  Constitutional:      General: She is not in acute distress.    Appearance: Normal appearance. She is normal weight.  HENT:     Head: Normocephalic and atraumatic.  Eyes:     Conjunctiva/sclera: Conjunctivae normal.  Pulmonary:     Effort: Pulmonary effort is normal.  Skin:    General: Skin is warm and dry.  Neurological:     Mental Status: She is alert and oriented to person, place, and time.  Psychiatric:        Mood and Affect: Mood normal.        Behavior: Behavior normal.        Thought Content: Thought content normal.        Judgment: Judgment normal.       Assessment  and Plan:  Tiffany Sherman is a 24 y.o. female presenting to the [facility] Department for a Women's Health problem visit  1. Encounter for counseling regarding contraception Counseled patient that Depo will actually protect her for up to 17 weeks after an injection, so it has not yet expired. Enc patient to try to get Depo in the 11-13 week range to have better bleeding control. Will do pregnancy test per patient request. - Pregnancy, urine  2. Screening for STD (sexually transmitted disease) Patient opts to self-collect vaginal samples for testing today.  Counseled patient how to collect for accurate results. Await test results.  Counseled that RN will call if needs to RTC for treatment once results are back.  - WET PREP FOR TRICH, YEAST, CLUE - Chlamydia/Gonorrhea [place] [facility] - HIV [place] [facility] - Syphilis Serology, [place] [facility]  3. Surveillance for Depo-Provera contraception OK to continue with Depo per 05/01/2020 order.     Return in about 11 weeks (around 10/30/2020) for Depo, annual and PRN.  No future appointments.  12/28/2020, PA

## 2020-09-03 ENCOUNTER — Encounter: Payer: Self-pay | Admitting: Emergency Medicine

## 2020-09-03 ENCOUNTER — Other Ambulatory Visit: Payer: Self-pay

## 2020-09-03 ENCOUNTER — Emergency Department
Admission: EM | Admit: 2020-09-03 | Discharge: 2020-09-03 | Disposition: A | Discharge status: Home / Self Care | Payer: Medicaid Other | Attending: Emergency Medicine | Admitting: Emergency Medicine

## 2020-09-03 DIAGNOSIS — Z91040 Latex allergy status: Secondary | ICD-10-CM | POA: Diagnosis not present

## 2020-09-03 DIAGNOSIS — J029 Acute pharyngitis, unspecified: Secondary | ICD-10-CM | POA: Insufficient documentation

## 2020-09-03 DIAGNOSIS — R519 Headache, unspecified: Secondary | ICD-10-CM | POA: Insufficient documentation

## 2020-09-03 DIAGNOSIS — R5381 Other malaise: Secondary | ICD-10-CM | POA: Diagnosis not present

## 2020-09-03 DIAGNOSIS — F909 Attention-deficit hyperactivity disorder, unspecified type: Secondary | ICD-10-CM | POA: Insufficient documentation

## 2020-09-03 DIAGNOSIS — F17210 Nicotine dependence, cigarettes, uncomplicated: Secondary | ICD-10-CM | POA: Diagnosis not present

## 2020-09-03 DIAGNOSIS — Z20822 Contact with and (suspected) exposure to covid-19: Secondary | ICD-10-CM | POA: Diagnosis not present

## 2020-09-03 DIAGNOSIS — R6883 Chills (without fever): Secondary | ICD-10-CM | POA: Diagnosis not present

## 2020-09-03 LAB — RESP PANEL BY RT-PCR (FLU A&B, COVID) ARPGX2
Influenza A by PCR: NEGATIVE
Influenza B by PCR: NEGATIVE
SARS Coronavirus 2 by RT PCR: NEGATIVE

## 2020-09-03 LAB — GROUP A STREP BY PCR: Group A Strep by PCR: DETECTED — AB

## 2020-09-03 MED ORDER — AMOXICILLIN 875 MG PO TABS: 875.0000 mg | ORAL_TABLET | Freq: Two times a day (BID) | ORAL | 0 refills | Status: AC

## 2020-09-03 MED ORDER — DEXAMETHASONE 10 MG/ML FOR PEDIATRIC ORAL USE
10.0000 mg | Freq: Once | INTRAMUSCULAR | Status: AC
  Administered 2020-09-03: 22:00:00 10 mg via ORAL
  Filled 2020-09-03: qty 1

## 2020-09-03 MED ORDER — AMOXICILLIN 500 MG PO CAPS
500.0000 mg | ORAL_CAPSULE | Freq: Once | ORAL | Status: AC
  Administered 2020-09-03: 22:00:00 500 mg via ORAL
  Filled 2020-09-03: qty 1

## 2020-09-03 NOTE — ED Notes (Signed)
No peripheral IV placed this visit.   Discharge instructions reviewed with patient. Questions fielded by this RN. Patient verbalizes understanding of instructions. Patient discharged home in stable condition per [facility] . No acute distress noted at time of discharge.

## 2020-09-03 NOTE — Discharge Instructions (Addendum)
If group A strep testing is positive, please take Amoxicillin. Take Amoxicillin twice daily for the next ten days.
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
## 2020-09-03 NOTE — ED Triage Notes (Signed)
Mother states that she started having a sore throat yesterday. Patient states that she saw white patches on the back of her throat. Patient states that today she has a headache.

## 2020-10-06 ENCOUNTER — Ambulatory Visit: Payer: Medicaid Other

## 2020-10-06 ENCOUNTER — Ambulatory Visit: Payer: Medicaid Other | Admitting: Physician Assistant

## 2020-10-06 ENCOUNTER — Other Ambulatory Visit: Payer: Self-pay

## 2020-10-06 ENCOUNTER — Encounter: Payer: Self-pay | Admitting: Physician Assistant

## 2020-10-06 DIAGNOSIS — Z113 Encounter for screening for infections with a predominantly sexual mode of transmission: Secondary | ICD-10-CM | POA: Diagnosis not present

## 2020-10-06 DIAGNOSIS — Z202 Contact with and (suspected) exposure to infections with a predominantly sexual mode of transmission: Secondary | ICD-10-CM | POA: Diagnosis not present

## 2020-10-06 LAB — WET PREP FOR TRICH, YEAST, CLUE
Trichomonas Exam: NEGATIVE
Yeast Exam: NEGATIVE

## 2020-10-06 MED ORDER — PENICILLIN G BENZATHINE 1200000 UNIT/2ML IM SUSP
2.4000 10*6.[IU] | Freq: Once | INTRAMUSCULAR | Status: AC
  Administered 2020-10-06: 2.4 10*6.[IU] via INTRAMUSCULAR
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
## 2020-10-06 NOTE — Progress Notes (Signed)
Post:  RN reviewed wet mount with patient. Patient treated as contact to syphilis per C. Hampton PA orders. Patient tolerated tx well. Patient instructed no sex until she gets test results back.   Harvie Heck, RN

## 2020-10-19 ENCOUNTER — Encounter: Payer: Self-pay | Admitting: Student

## 2020-10-19 LAB — HM HIV SCREENING LAB: HM HIV Screening: NEGATIVE

## 2021-02-22 ENCOUNTER — Other Ambulatory Visit: Payer: Self-pay

## 2021-02-22 ENCOUNTER — Ambulatory Visit (LOCAL_COMMUNITY_HEALTH_CENTER): Payer: Medicaid Other | Admitting: Physician Assistant

## 2021-02-22 VITALS — BP 127/73 | Ht 68.0 in | Wt 140.4 lb

## 2021-02-22 DIAGNOSIS — Z3009 Encounter for other general counseling and advice on contraception: Secondary | ICD-10-CM

## 2021-02-22 DIAGNOSIS — Z30013 Encounter for initial prescription of injectable contraceptive: Secondary | ICD-10-CM | POA: Diagnosis not present

## 2021-02-22 LAB — PREGNANCY, URINE: Preg Test, Ur: NEGATIVE

## 2021-02-22 NOTE — Progress Notes (Signed)
Patient here for Depo. She states she called her primary doctor yesterday and they told her that she had her last Depo shot there, at [facility], on 11/20/2020. Patient is 64 3/7 since that date. TC to [employer], no answer. ROI for last Depo signed today, faxed. Last PE was 03/02/2020, last Pap was 04/20/2018, negative. Depo consent is scanned from 05/03/2020. PT today is negative. Depo given per provider orders, and Dr. Alvester Morin orders for Depo x 1 year from 04/2020. Next Depo card given and patient counseled to schedule PE and pap with next Depo. Burt Knack, RN

## 2021-10-25 ENCOUNTER — Ambulatory Visit: Payer: Medicaid Other

## 2021-10-31 IMAGING — DX DG CHEST 1V PORT
1 series · 1 of 1 positions shown · non-contrast
Comparison: Chest radiograph 02/22/2015

CLINICAL DATA: Patient with productive cough.

EXAM:
PORTABLE CHEST 1 VIEW

[chest ap]
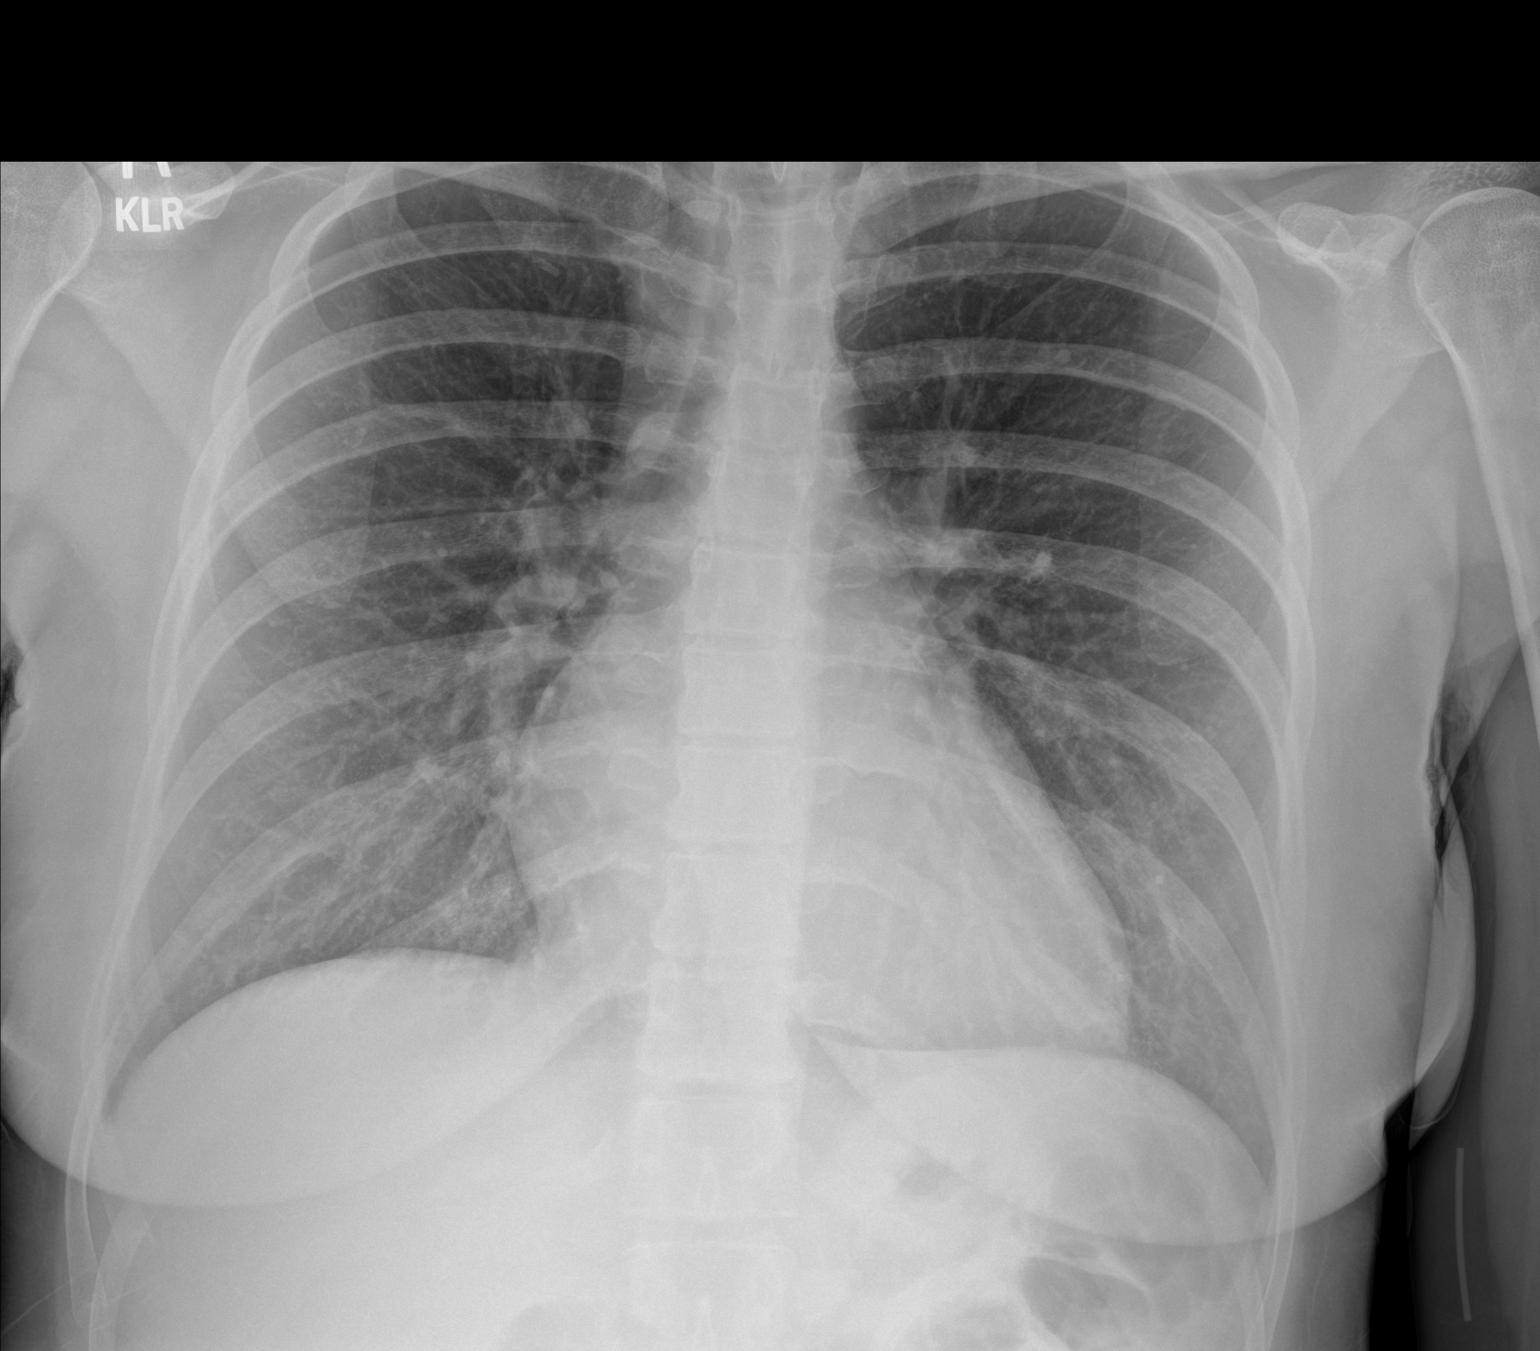

[1 of 1 positions shown; findings below may reference images not displayed]

FINDINGS: Normal cardiac and mediastinal contours. No consolidative pulmonary
opacities. No pleural effusion or pneumothorax. Osseous structures
unremarkable.
IMPRESSION: No acute cardiopulmonary process.

## 2021-11-25 ENCOUNTER — Emergency Department
Admission: EM | Admit: 2021-11-25 | Discharge: 2021-11-25 | Disposition: A | Discharge status: Home / Self Care | Payer: Medicaid Other | Attending: Emergency Medicine | Admitting: Emergency Medicine

## 2021-11-25 ENCOUNTER — Emergency Department: Payer: Medicaid Other

## 2021-11-25 ENCOUNTER — Other Ambulatory Visit: Payer: Self-pay

## 2021-11-25 DIAGNOSIS — S46812A Strain of other muscles, fascia and tendons at shoulder and upper arm level, left arm, initial encounter: Secondary | ICD-10-CM | POA: Diagnosis not present

## 2021-11-25 DIAGNOSIS — S40922A Unspecified superficial injury of left upper arm, initial encounter: Secondary | ICD-10-CM | POA: Diagnosis present

## 2021-11-25 DIAGNOSIS — X500XXA Overexertion from strenuous movement or load, initial encounter: Secondary | ICD-10-CM | POA: Diagnosis not present

## 2021-11-25 DIAGNOSIS — T1490XA Injury, unspecified, initial encounter: Secondary | ICD-10-CM

## 2021-11-25 MED ORDER — OXYCODONE-ACETAMINOPHEN 5-325 MG PO TABS
1.0000 | ORAL_TABLET | ORAL | Status: AC
  Administered 2021-11-25: 1 via ORAL
  Filled 2021-11-25: qty 1

## 2021-11-25 MED ORDER — KETOROLAC TROMETHAMINE 60 MG/2ML IM SOLN
30.0000 mg | Freq: Once | INTRAMUSCULAR | Status: AC
  Administered 2021-11-25: 30 mg via INTRAMUSCULAR
  Filled 2021-11-25: qty 2

## 2021-11-25 MED ORDER — OXYCODONE-ACETAMINOPHEN 5-325 MG PO TABS
1.0000 | ORAL_TABLET | Freq: Four times a day (QID) | ORAL | 0 refills | Status: DC | PRN
Start: 2021-11-25 — End: 2021-12-12

## 2021-11-25 NOTE — ED Triage Notes (Signed)
Pt come with c/o left arm pain from heavy moving. Pt states this started on Monday. Pt denies filing for workers comp.  ?
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
## 2021-11-27 ENCOUNTER — Emergency Department: Payer: Medicaid Other

## 2021-11-27 ENCOUNTER — Emergency Department
Admission: EM | Admit: 2021-11-27 | Discharge: 2021-11-27 | Disposition: A | Discharge status: Home / Self Care | Payer: Medicaid Other | Attending: Student in an Organized Health Care Education/Training Program | Admitting: Student in an Organized Health Care Education/Training Program

## 2021-11-27 ENCOUNTER — Encounter: Payer: Self-pay | Admitting: Emergency Medicine

## 2021-11-27 ENCOUNTER — Other Ambulatory Visit: Payer: Self-pay

## 2021-11-27 DIAGNOSIS — M25512 Pain in left shoulder: Secondary | ICD-10-CM | POA: Diagnosis not present

## 2021-11-27 DIAGNOSIS — M542 Cervicalgia: Secondary | ICD-10-CM | POA: Insufficient documentation

## 2021-11-27 MED ORDER — DEXAMETHASONE SODIUM PHOSPHATE 10 MG/ML IJ SOLN
20.0000 mg | Freq: Once | INTRAMUSCULAR | Status: AC
  Administered 2021-11-27: 20 mg via INTRAVENOUS
  Filled 2021-11-27: qty 2

## 2021-11-27 MED ORDER — PREDNISONE 10 MG (21) PO TBPK: ORAL_TABLET | ORAL | 0 refills | Status: DC

## 2021-11-27 MED ORDER — LORAZEPAM 0.5 MG PO TABS: 0.5000 mg | ORAL_TABLET | Freq: Once | ORAL | Status: DC

## 2021-11-27 MED ORDER — METHYLPREDNISOLONE 4 MG PO TBPK: ORAL_TABLET | ORAL | 0 refills | Status: DC

## 2021-11-27 MED ORDER — METHOCARBAMOL 500 MG PO TABS
1000.0000 mg | ORAL_TABLET | Freq: Once | ORAL | Status: AC
  Administered 2021-11-27: 1000 mg via ORAL
  Filled 2021-11-27: qty 2

## 2021-11-27 NOTE — ED Notes (Signed)
See triage note  presents with pain to left shoulder and moves into left arm  states started about 1-2 weeks ago  has been persistent  good pulses ?

## 2021-11-27 NOTE — ED Provider Notes (Signed)
? ?[facility] ?Provider Note ? ?Patient Contact: 5:08 PM (approximate) ? ? ?History  ? ?Shoulder Pain ? ? ?HPI ? ?Tiffany Sherman is a 26 y.o. female presents to the emergency department with left posterior shoulder pain that radiates down patient's left lower extremity into her hand.  Patient states that she has experienced muscle spasms in her hand and has had changes in grip strength.  She denies falls or mechanisms of trauma.  She was evaluated on March 5 and was prescribed Percocet for musculoskeletal pain and she reports that medications not helping her.  She denies any falls or mechanisms of trauma.  She said no fever or chills at home or changes in vision.  No headache.  She denies experiencing similar symptoms in the past. ? ?  ? ? ?Physical Exam  ? ?Triage Vital Signs: ?ED Triage Vitals  ?Enc Vitals Group  ?   BP 11/27/21 1652 137/90  ?   Pulse Rate 11/27/21 1652 82  ?   Resp 11/27/21 1652 17  ?   Temp 11/27/21 1652 98 ?F (36.7 ?C)  ?   Temp Source 11/27/21 1652 Oral  ?   SpO2 11/27/21 1652 100 %  ?   Weight 11/27/21 1652 140 lb 6.9 oz (63.7 kg)  ?   Height 11/27/21 1652 5\' 8"  (1.727 m)  ?   Head Circumference --   ?   Peak Flow --   ?   Pain Score 11/27/21 1651 10  ?   Pain Loc --   ?   Pain Edu? --   ?   Excl. in GC? --   ? ? ?Most recent vital signs: ?Vitals:  ? 11/27/21 1652 11/27/21 1755  ?BP: 137/90 138/88  ?Pulse: 82 78  ?Resp: 17 16  ?Temp: 98 ?F (36.7 ?C)   ?SpO2: 100% 100%  ? ? ? ?General: Alert and in no acute distress. ?Eyes:  PERRL. EOMI. ?Head: No acute traumatic findings ?ENT: ?     Ears:  ?     Nose: No congestion/rhinnorhea. ?     Mouth/Throat: Mucous membranes are moist. ?Neck: No stridor. No cervical spine tenderness to palpation.  Patient performs limited range of motion at the neck.  Numbness and tingling of left upper extremity are reproduced with range of motion at the neck. ?Cardiovascular:  Good peripheral perfusion ?Respiratory: Normal respiratory effort  without tachypnea or retractions. Lungs CTAB. Good air entry to the bases with no decreased or absent breath sounds. ?Gastrointestinal: Bowel sounds ?4 quadrants. Soft and nontender to palpation. No guarding or rigidity. No palpable masses. No distention. No CVA tenderness. ?Musculoskeletal: Patient has 5 out of 5 grip strength on the right and 3 out of 5 grip strength on the left.  ?Neurologic: Sensation of left upper extremity intact. ?Skin:   No rash noted ?Other: ? ? ?ED Results / Procedures / Treatments  ? ?Labs ?(all labs ordered are listed, but only abnormal results are displayed) ?Labs Reviewed - No data to display ? ? ? ? ?RADIOLOGY ? ?I personally viewed and evaluated these images as part of my medical decision making, as well as reviewing the written report by the radiologist. ? 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  I personally reviewed MRI and agree with radiologist interpretation. ? ? ?PROCEDURES: ? ?Critical Care performed: No ? ?Procedures ? ? ?MEDICATIONS ORDERED IN ED: ?Medications  ?LORazepam (ATIVAN) tablet 0.5 mg (has no administration in time  range)  ?dexamethasone (DECADRON) injection 20 mg (has no administration in time range)  ?methocarbamol (ROBAXIN) tablet 1,000 mg (1,000 mg Oral Given 11/27/21 1744)  ? ? ? ?IMPRESSION / MDM / ASSESSMENT AND PLAN / ED COURSE  ?I reviewed the triage vital signs and the nursing notes. ?             ?               ? ?Differential diagnosis includes, but is not limited to, cervical radiculopathy, herniated disc, cervical stenosis ?Assessment and plan ? ?Assessment and plan ?Neck pain ?26 year old female presents to the emergency department with progressively worsening shoulder pain that radiates into the left upper extremity with diminished grip strength and numbness in the hand. ? ?Vital signs were reassuring at triage.  On physical exam, patient had 3 out of 5 grip strength on the left and 5 out of 5  grip strength on the right.  Sensation was intact.  Patient had reproducible pain with range of motion at the neck. ? ?CT of the cervical spine suggested possible disc herniation at C5-C6 and recommended MRI for further characterization.  MRI of the cervical spine indicated large osteophyte herniated disc complex at C5-C6. ? ?I reached out to neurosurgeon on-call, Dr. Marcell Barlow.  Very much appreciate time and consult.  Dr. Marcell Barlow recommended high-dose dexamethasone in the emergency department and Medrol Dosepak.  He offered to have patient admitted but patient stated that she needs to arrange childcare.  He anticipates seeing patient in the office at 11:30 in the morning for possible surgery later in the week.  Patient was given return precautions to return to the emergency department with new or worsening symptoms.  She feels comfortable being discharged and has easy access to the emergency department should her symptoms worsen. ? ? ?FINAL CLINICAL IMPRESSION(S) / ED DIAGNOSES  ? ?Final diagnoses:  ?Neck pain  ? ? ? ?Rx / DC Orders  ? ?ED Discharge Orders   ? ?      Ordered  ?  predniSONE (STERAPRED UNI-PAK 21 TAB) 10 MG (21) TBPK tablet  Status:  Discontinued       ? 11/27/21 1934  ?  methylPREDNISolone (MEDROL DOSEPAK) 4 MG TBPK tablet       ? 11/27/21 1938  ? ?  ?  ? ?  ? ? ? ?Note:  This document was prepared using Dragon voice recognition software and may include unintentional dictation errors. ?  ?Orvil Feil, PA-C ?11/27/21 1947 ? ?  ?Willy Eddy, MD ?11/27/21 2117 ? ?

## 2021-11-27 NOTE — ED Triage Notes (Signed)
Arrives c/o persistent pain to left shoulder. States pain shooting up to neck and in certain positions arm feels numb.  Called PCP who referred patient back to ED to "do something". ?

## 2021-11-27 NOTE — Discharge Instructions (Addendum)
Dr. Osborne Oman office should be reaching out to schedule an appointment for either tomorrow at 11:30 AM or later in the week. ?Please start Medrol Dosepak as requested by Dr. Marcell Barlow. ?

## 2021-11-28 ENCOUNTER — Other Ambulatory Visit: Payer: Self-pay | Admitting: Neurosurgery

## 2021-11-28 DIAGNOSIS — Z01818 Encounter for other preprocedural examination: Secondary | ICD-10-CM

## 2021-12-04 ENCOUNTER — Other Ambulatory Visit
Admission: RE | Admit: 2021-12-04 | Discharge: 2021-12-04 | Disposition: A | Discharge status: Home / Self Care | Payer: Medicaid Other | Source: Ambulatory Visit | Attending: Neurosurgery | Admitting: Neurosurgery

## 2021-12-04 ENCOUNTER — Other Ambulatory Visit: Payer: Self-pay

## 2021-12-04 HISTORY — DX: Pneumonia, unspecified organism: J18.9

## 2021-12-04 HISTORY — DX: Other psychoactive substance abuse, uncomplicated: F19.10

## 2021-12-04 NOTE — Pre-Procedure Instructions (Signed)
Patient was scheduled for In person pre admission appointment today, 03/14 at 2;30 PM. Patient failed to come to appointment, this writer called patient and she voiced that she had another appointment today and forgot about this one. We did her pre admission appointment over the phone with no complications, she will be here tomorrow, 03/15 for her ordered labs.  ?

## 2021-12-04 NOTE — Patient Instructions (Addendum)
Your procedure is scheduled on: 12/12/21 - Wednesday ?Report to the Registration Desk on the 1st floor of the [facility]. ?To find out your arrival time, please call [phone] between 1PM - 3PM on: 12/11/21 - Tuesday ?Report to [facility] for Labs/Bag on 12/05/21 at 8 am. ? ?REMEMBER: ?Instructions that are not followed completely may result in serious medical risk, up to and including death; or upon the discretion of your surgeon and anesthesiologist your surgery may need to be rescheduled. ? ?Do not eat food after midnight the night before surgery.  ?No gum chewing, lozengers or hard candies. ? ?You may however, drink CLEAR liquids up to 2 hours before you are scheduled to arrive for your surgery. Do not drink anything within 2 hours of your scheduled arrival time. ? ?Clear liquids include: ?- water  ?- apple juice without pulp ?- gatorade (not RED colors) ?- black coffee or tea (Do NOT add milk or creamers to the coffee or tea) ?Do NOT drink anything that is not on this list. ? ?TAKE THESE MEDICATIONS THE MORNING OF SURGERY WITH A SIP OF WATER: NONE ? ?One week prior to surgery: ?Stop Anti-inflammatories (NSAIDS) such as Advil, Aleve, Ibuprofen, Motrin, Naproxen, Naprosyn and Aspirin based products such as Excedrin, Goodys Powder, BC Powder. ? ?Stop ANY OVER THE COUNTER supplements until after surgery. ? ?You may take Tylenol if needed for pain up until the day of surgery. ? ?No Alcohol for 24 hours before or after surgery. ? ?No Smoking including e-cigarettes for 24 hours prior to surgery.  ?No chewable tobacco products for at least 6 hours prior to surgery.  ?No nicotine patches on the day of surgery. ? ?Do not use any "recreational" drugs for at least a week prior to your surgery.  ?Please be advised that the combination of cocaine and anesthesia may have negative outcomes, up to and including death. ?If you test positive for cocaine, your surgery will be cancelled. ? ?On the morning of surgery  brush your teeth with toothpaste and water, you may rinse your mouth with mouthwash if you wish. ?Do not swallow any toothpaste or mouthwash. ? ?Use CHG Soap or wipes as directed on instruction sheet. ? ?Do not wear jewelry, make-up, hairpins, clips or nail polish. ? ?Do not wear lotions, powders, or perfumes.  ? ?Do not shave body from the neck down 48 hours prior to surgery just in case you cut yourself which could leave a site for infection.  ?Also, freshly shaved skin may become irritated if using the CHG soap. ? ?Contact lenses, hearing aids and dentures may not be worn into surgery. ? ?Do not bring valuables to the hospital. [facility] is not responsible for any missing/lost belongings or valuables.  ? ?Notify your doctor if there is any change in your medical condition (cold, fever, infection). ? ?Wear comfortable clothing (specific to your surgery type) to the hospital. ? ?After surgery, you can help prevent lung complications by doing breathing exercises.  ?Take deep breaths and cough every 1-2 hours. Your doctor may order a device called an Incentive Spirometer to help you take deep breaths. ?When coughing or sneezing, hold a pillow firmly against your incision with both hands. This is called ?splinting.? Doing this helps protect your incision. It also decreases belly discomfort. ? ?If you are being admitted to the hospital overnight, leave your suitcase in the car. ?After surgery it may be brought to your room. ? ?If you are being discharged the day  of surgery, you will not be allowed to drive home. ?You will need a responsible adult (18 years or older) to drive you home and stay with you that night.  ? ?If you are taking public transportation, you will need to have a responsible adult (18 years or older) with you. ?Please confirm with your physician that it is acceptable to use public transportation.  ? ?Please call the Pre-admissions Testing Dept. at [phone] if you have any questions about these  instructions. ? ?Surgery Visitation Policy: ? ?Patients undergoing a surgery or procedure may have one family member or support person with them as long as that person is not COVID-19 positive or experiencing its symptoms.  ?That person may remain in the waiting area during the procedure and may rotate out with other people. ? ?Inpatient Visitation:   ? ?Visiting hours are 7 a.m. to 8 p.m. ?Up to two visitors ages 16+ are allowed at one time in a patient room. The visitors may rotate out with other people during the day. Visitors must check out when they leave, or other visitors will not be allowed. One designated support person may remain overnight. ?The visitor must pass COVID-19 screenings, use hand sanitizer when entering and exiting the patient?s room and wear a mask at all times, including in the patient?s room. ?Patients must also wear a mask when staff or their visitor are in the room. ?Masking is required regardless of vaccination status.  ?

## 2021-12-05 ENCOUNTER — Other Ambulatory Visit
Admission: RE | Admit: 2021-12-05 | Discharge: 2021-12-05 | Disposition: A | Discharge status: Home / Self Care | Payer: Medicaid Other | Source: Ambulatory Visit | Attending: Neurosurgery | Admitting: Neurosurgery

## 2021-12-05 DIAGNOSIS — Z01812 Encounter for preprocedural laboratory examination: Secondary | ICD-10-CM | POA: Diagnosis not present

## 2021-12-05 LAB — URINALYSIS, ROUTINE W REFLEX MICROSCOPIC
Bilirubin Urine: NEGATIVE
Glucose, UA: NEGATIVE mg/dL
Ketones, ur: NEGATIVE mg/dL
Nitrite: POSITIVE — AB
Protein, ur: NEGATIVE mg/dL
Specific Gravity, Urine: 1.016 (ref 1.005–1.030)
pH: 5 (ref 5.0–8.0)

## 2021-12-05 LAB — BASIC METABOLIC PANEL
Anion gap: 7 (ref 5–15)
BUN: 12 mg/dL (ref 6–20)
CO2: 26 mmol/L (ref 22–32)
Calcium: 9.1 mg/dL (ref 8.9–10.3)
Chloride: 104 mmol/L (ref 98–111)
Creatinine, Ser: 0.66 mg/dL (ref 0.44–1.00)
GFR, Estimated: >60 mL/min (ref >60)
Glucose, Bld: 94 mg/dL (ref 70–99)
Potassium: 3.6 mmol/L (ref 3.5–5.1)
Sodium: 137 mmol/L (ref 135–145)

## 2021-12-05 LAB — CBC
HCT: 44.9 % (ref 36.0–46.0)
Hemoglobin: 14.2 g/dL (ref 12.0–15.0)
MCH: 28.7 pg (ref 26.0–34.0)
MCHC: 31.6 g/dL (ref 30.0–36.0)
MCV: 90.9 fL (ref 80.0–100.0)
Platelets: 269 10*3/uL (ref 150–400)
RBC: 4.94 MIL/uL (ref 3.87–5.11)
RDW: 14.3 % (ref 11.5–15.5)
WBC: 6.9 10*3/uL (ref 4.0–10.5)
nRBC: 0 % (ref 0.0–0.2)

## 2021-12-05 LAB — SURGICAL PCR SCREEN
MRSA, PCR: POSITIVE — AB
Staphylococcus aureus: POSITIVE — AB

## 2021-12-05 LAB — APTT: aPTT: 31 seconds (ref 24–36)

## 2021-12-05 LAB — PROTIME-INR
INR: 1 (ref 0.8–1.2)
Prothrombin Time: 13.6 seconds (ref 11.4–15.2)

## 2021-12-05 LAB — TYPE AND SCREEN
ABO/RH(D): A POS
Antibody Screen: NEGATIVE

## 2021-12-05 NOTE — Progress Notes (Signed)
?  Perioperative Services ? ?Abnormal Lab Notification ? ?Date: 12/05/21 ? ?Name: Tiffany Sherman ?MRN:   [id number] ? ?Re: Abnormal labs noted during PAT appointment ? ?Provider Notified: Venetia Night, MD ?Notification mode: Routed and/or faxed via [facility] ? ?Labs of concern: ?Lab Results  ?Component Value Date  ? STAPHAUREUS POSITIVE (A) 12/05/2021  ? MRSAPCR POSITIVE (A) 12/05/2021  ?  ?Notes: Patient is scheduled for a C5-6 ARTHROPLASTY on 12/12/2021. She is scheduled to receive CEFAZOLIN pre-operatively. Surgical PCR (+) for MRSA; see above. ? ?PLANS:  ?Review renal function. Estimated Creatinine Clearance: 107.2 mL/min (by C-G formula based on SCr of 0.66 mg/dL). ?Review allergies. No documented allergy to vancomycin. ?Order added for VANCOMYCIN 1 GRAM IV to current preoperative prophylactic regimen. Patient with orders for both CEFAZOLIN and VANCOMYCIN to be given in the setting of documented MRSA (+) surgical PCR.  ?Notify primary attending surgeon of (+) MRSA result that order has been placed for the Vancomycin by perioperative APP.  ? ?This is a Personal assistant; no formal response is required. ? ?Quentin Mulling, MSN, APRN, FNP-C, CEN ?[facility]  ?Peri-operative Services Nurse Practitioner ?Phone: [phone] ?12/05/21 10:40 AM ? ?

## 2021-12-08 DIAGNOSIS — Z01812 Encounter for preprocedural laboratory examination: Secondary | ICD-10-CM

## 2021-12-08 DIAGNOSIS — B9620 Unspecified Escherichia coli [E. coli] as the cause of diseases classified elsewhere: Secondary | ICD-10-CM

## 2021-12-08 LAB — URINE CULTURE: Culture: >=100000 — AB

## 2021-12-08 NOTE — Progress Notes (Signed)
?  [facility] ?Perioperative Services: Pre-Admission/Anesthesia Testing ? ?Abnormal Lab Notification and Treatment Plan of Care ?  ?Date: 12/08/21 ? ?Name: Tiffany Sherman ?MRN:   [id number] ? ?Re: Abnormal labs noted during PAT appointment  ? ?Notified:  ?Provider Name Provider Role Notification Mode  ?Meade Maw, MD Neurosurgery Routed and/or faxed via [facility]  ? ?Abnormal Lab Value(s):  ? ?Lab Results  ?Component Value Date  ? COLORURINE YELLOW (A) 12/05/2021  ? APPEARANCEUR HAZY (A) 12/05/2021  ? LABSPEC 1.016 12/05/2021  ? PHURINE 5.0 12/05/2021  ? GLUCOSEU NEGATIVE 12/05/2021  ? HGBUR MODERATE (A) 12/05/2021  ? [place] NEGATIVE 12/05/2021  ? [place] NEGATIVE 12/05/2021  ? PROTEINUR NEGATIVE 12/05/2021  ? NITRITE POSITIVE (A) 12/05/2021  ? LEUKOCYTESUR TRACE (A) 12/05/2021  ? EPIU 0-5 12/05/2021  ? WBCU 6-10 12/05/2021  ? RBCU 0-5 12/05/2021  ? BACTERIA MANY (A) 12/05/2021  ? CULT >=100,000 COLONIES/mL ESCHERICHIA COLI (A) 12/05/2021  ? ? ?Clinical Information and Notes:  ?Patient is scheduled for a C5-C6 ARTHROPLASTY on 12/12/2021.   ? ?UA performed in PAT consistent with for infection.  ?No leukocytosis noted on CBC; WBC 6900 ?Renal function: Estimated Creatinine Clearance: 107.2 mL/min (by C-G formula based on SCr of 0.66 mg/dL). ?Urine C&S added to assess for pathogenically significant growth. ? ?Impression and Plan:  ?[name] with a UA that was (+) for infection; reflex culture sent. Culture (+) for E.coli. Contacted patient to discuss. Patient advising that results had already been reviewed by her PCP and that she was on a Rx for her infection. Patient encouraged to use Rx as prescribed in efforts to clear infection prior to upcoming neurosurgical procedure.  ? ?Patient encouraged to increase her fluid intake as much as possible. Discussed that water is always best to flush the urinary tract. She was advised to avoid caffeine containing fluids until her infections  clears, as caffeine can cause her to experience painful bladder spasms.  ? ?May use Tylenol as needed for pain/fever should she experience these symptoms.  ? ?Patient instructed to call PCP, surgeon's office, or PAT with any questions or concerns related to the above outlined course of treatment. Additionally, she was instructed to call if she feels like she is getting worse overall while on treatment. Results and treatment plan of care forwarded to primary attending surgeon to make them aware.  ? ?Encounter Diagnoses  ?Name Primary?  ? Pre-operative laboratory examination Yes  ? E. coli UTI (urinary tract infection)   ? ?Honor Loh, MSN, APRN, FNP-C, CEN ?[facility]  ?Peri-operative Services Nurse Practitioner ?Phone: [phone] ?Fax: [phone] ?12/08/21 9:02 PM ? ?NOTE: This note has been prepared using [employer]. Despite my best ability to proofread, there is always the potential that unintentional transcriptional errors may still occur from this process. ? ?

## 2021-12-12 ENCOUNTER — Encounter
Admission: RE | Disposition: A | Discharge status: Home / Self Care | Payer: Self-pay | Source: Home / Self Care | Attending: Neurosurgery

## 2021-12-12 ENCOUNTER — Ambulatory Visit: Payer: Medicaid Other

## 2021-12-12 ENCOUNTER — Ambulatory Visit
Admission: RE | Admit: 2021-12-12 | Discharge: 2021-12-12 | Disposition: A | Discharge status: Home / Self Care | Payer: Medicaid Other | Attending: Neurosurgery | Admitting: Neurosurgery

## 2021-12-12 ENCOUNTER — Other Ambulatory Visit: Payer: Self-pay

## 2021-12-12 ENCOUNTER — Ambulatory Visit: Payer: Medicaid Other | Admitting: Urgent Care

## 2021-12-12 ENCOUNTER — Encounter: Payer: Self-pay | Admitting: Neurosurgery

## 2021-12-12 DIAGNOSIS — F17210 Nicotine dependence, cigarettes, uncomplicated: Secondary | ICD-10-CM | POA: Diagnosis not present

## 2021-12-12 DIAGNOSIS — M5412 Radiculopathy, cervical region: Secondary | ICD-10-CM | POA: Diagnosis present

## 2021-12-12 HISTORY — PX: CERVICAL DISC ARTHROPLASTY: SHX587

## 2021-12-12 LAB — POCT PREGNANCY, URINE: Preg Test, Ur: NEGATIVE

## 2021-12-12 SURGERY — CERVICAL ANTERIOR DISC ARTHROPLASTY
Anesthesia: General

## 2021-12-12 MED ORDER — OXYCODONE-ACETAMINOPHEN 5-325 MG PO TABS: 1.0000 | ORAL_TABLET | ORAL | 0 refills | Status: AC | PRN

## 2021-12-12 MED ORDER — SEVOFLURANE IN SOLN
RESPIRATORY_TRACT | Status: AC
  Filled 2021-12-12: qty 250

## 2021-12-12 MED ORDER — REMIFENTANIL HCL 1 MG IV SOLR
INTRAVENOUS | Status: AC
  Filled 2021-12-12: qty 1000

## 2021-12-12 MED ORDER — ONDANSETRON HCL 4 MG/2ML IJ SOLN
INTRAMUSCULAR | Status: AC
  Filled 2021-12-12: qty 2

## 2021-12-12 MED ORDER — TIZANIDINE HCL 4 MG PO CAPS
4.0000 mg | ORAL_CAPSULE | Freq: Three times a day (TID) | ORAL | 0 refills | Status: AC | PRN
Start: 2021-12-12 — End: ?

## 2021-12-12 MED ORDER — CEFAZOLIN SODIUM-DEXTROSE 2-4 GM/100ML-% IV SOLN
2.0000 g | Freq: Once | INTRAVENOUS | Status: AC
  Administered 2021-12-12: 2 g via INTRAVENOUS

## 2021-12-12 MED ORDER — SEVOFLURANE IN SOLN
RESPIRATORY_TRACT | Status: AC
  Filled 2021-12-12: qty 500

## 2021-12-12 MED ORDER — CELECOXIB 100 MG PO CAPS: 100.0000 mg | ORAL_CAPSULE | Freq: Two times a day (BID) | ORAL | 0 refills | Status: AC

## 2021-12-12 MED ORDER — PROPOFOL 10 MG/ML IV BOLUS
INTRAVENOUS | Status: AC
  Filled 2021-12-12: qty 40

## 2021-12-12 MED ORDER — HYDROMORPHONE HCL 1 MG/ML IJ SOLN
INTRAMUSCULAR | Status: DC | PRN
  Administered 2021-12-12 (×2): .5 mg via INTRAVENOUS

## 2021-12-12 MED ORDER — KETAMINE HCL 10 MG/ML IJ SOLN
INTRAMUSCULAR | Status: DC | PRN
  Administered 2021-12-12: 20 mg via INTRAVENOUS
  Administered 2021-12-12: 10 mg via INTRAVENOUS

## 2021-12-12 MED ORDER — DEXAMETHASONE SODIUM PHOSPHATE 10 MG/ML IJ SOLN
INTRAMUSCULAR | Status: DC | PRN
  Administered 2021-12-12: 10 mg via INTRAVENOUS

## 2021-12-12 MED ORDER — EPHEDRINE SULFATE (PRESSORS) 50 MG/ML IJ SOLN
INTRAMUSCULAR | Status: DC | PRN
  Administered 2021-12-12: 5 mg via INTRAVENOUS

## 2021-12-12 MED ORDER — HYDROMORPHONE HCL 1 MG/ML IJ SOLN
INTRAMUSCULAR | Status: AC
  Administered 2021-12-12: 0.5 mg via INTRAVENOUS
  Filled 2021-12-12: qty 1

## 2021-12-12 MED ORDER — METHOCARBAMOL 500 MG PO TABS
ORAL_TABLET | ORAL | Status: AC
  Filled 2021-12-12: qty 1

## 2021-12-12 MED ORDER — ACETAMINOPHEN 10 MG/ML IV SOLN
INTRAVENOUS | Status: AC
  Filled 2021-12-12: qty 100

## 2021-12-12 MED ORDER — PROPOFOL 500 MG/50ML IV EMUL
INTRAVENOUS | Status: DC | PRN
  Administered 2021-12-12: 150 ug/kg/min via INTRAVENOUS

## 2021-12-12 MED ORDER — FENTANYL CITRATE (PF) 100 MCG/2ML IJ SOLN
INTRAMUSCULAR | Status: AC
  Filled 2021-12-12: qty 2

## 2021-12-12 MED ORDER — PHENYLEPHRINE 40 MCG/ML (10ML) SYRINGE FOR IV PUSH (FOR BLOOD PRESSURE SUPPORT)
PREFILLED_SYRINGE | INTRAVENOUS | Status: DC | PRN
  Administered 2021-12-12 (×3): 40 ug via INTRAVENOUS

## 2021-12-12 MED ORDER — HYDROMORPHONE HCL 1 MG/ML IJ SOLN
0.2500 mg | INTRAMUSCULAR | Status: DC | PRN
Start: 2021-12-12 — End: 2021-12-12

## 2021-12-12 MED ORDER — ORAL CARE MOUTH RINSE: 15.0000 mL | Freq: Once | OROMUCOSAL | Status: AC

## 2021-12-12 MED ORDER — PHENYLEPHRINE HCL-NACL 20-0.9 MG/250ML-% IV SOLN
INTRAVENOUS | Status: DC | PRN
  Administered 2021-12-12: 15 ug/min via INTRAVENOUS

## 2021-12-12 MED ORDER — SODIUM CHLORIDE 0.9 % IV SOLN
INTRAVENOUS | Status: DC | PRN
  Administered 2021-12-12: .2 ug/kg/min via INTRAVENOUS

## 2021-12-12 MED ORDER — PROPOFOL 500 MG/50ML IV EMUL
INTRAVENOUS | Status: AC
  Filled 2021-12-12: qty 50

## 2021-12-12 MED ORDER — KETAMINE HCL 50 MG/5ML IJ SOSY
PREFILLED_SYRINGE | INTRAMUSCULAR | Status: AC
  Filled 2021-12-12: qty 5

## 2021-12-12 MED ORDER — MIDAZOLAM HCL 2 MG/2ML IJ SOLN
INTRAMUSCULAR | Status: AC
  Filled 2021-12-12: qty 2

## 2021-12-12 MED ORDER — DROPERIDOL 2.5 MG/ML IJ SOLN
0.6250 mg | Freq: Once | INTRAMUSCULAR | Status: DC | PRN
  Filled 2021-12-12: qty 2

## 2021-12-12 MED ORDER — SURGIFLO WITH THROMBIN (HEMOSTATIC MATRIX KIT) OPTIME
TOPICAL | Status: DC | PRN
  Administered 2021-12-12: 1 via TOPICAL

## 2021-12-12 MED ORDER — KETOROLAC TROMETHAMINE 15 MG/ML IJ SOLN
INTRAMUSCULAR | Status: AC
  Filled 2021-12-12: qty 1

## 2021-12-12 MED ORDER — FAMOTIDINE 20 MG PO TABS
ORAL_TABLET | ORAL | Status: AC
  Administered 2021-12-12: 20 mg via ORAL
  Filled 2021-12-12: qty 1

## 2021-12-12 MED ORDER — FENTANYL CITRATE (PF) 250 MCG/5ML IJ SOLN
INTRAMUSCULAR | Status: AC
  Filled 2021-12-12: qty 5

## 2021-12-12 MED ORDER — METHOCARBAMOL 500 MG PO TABS
500.0000 mg | ORAL_TABLET | Freq: Once | ORAL | Status: AC
  Administered 2021-12-12: 500 mg via ORAL

## 2021-12-12 MED ORDER — ACETAMINOPHEN 10 MG/ML IV SOLN: 1000.0000 mg | Freq: Once | INTRAVENOUS | Status: DC | PRN

## 2021-12-12 MED ORDER — CHLORHEXIDINE GLUCONATE 0.12 % MT SOLN: 15.0000 mL | Freq: Once | OROMUCOSAL | Status: AC

## 2021-12-12 MED ORDER — HYDROMORPHONE HCL 1 MG/ML IJ SOLN
INTRAMUSCULAR | Status: AC
  Filled 2021-12-12: qty 1

## 2021-12-12 MED ORDER — FENTANYL CITRATE (PF) 100 MCG/2ML IJ SOLN
INTRAMUSCULAR | Status: DC | PRN
  Administered 2021-12-12 (×3): 50 ug via INTRAVENOUS

## 2021-12-12 MED ORDER — DEXMEDETOMIDINE HCL IN NACL 200 MCG/50ML IV SOLN
INTRAVENOUS | Status: AC
  Filled 2021-12-12: qty 50

## 2021-12-12 MED ORDER — KETOROLAC TROMETHAMINE 15 MG/ML IJ SOLN
15.0000 mg | Freq: Once | INTRAMUSCULAR | Status: AC
  Administered 2021-12-12: 15 mg via INTRAVENOUS

## 2021-12-12 MED ORDER — GABAPENTIN 400 MG PO CAPS: 400.0000 mg | ORAL_CAPSULE | Freq: Once | ORAL | Status: AC

## 2021-12-12 MED ORDER — DEXAMETHASONE SODIUM PHOSPHATE 10 MG/ML IJ SOLN
INTRAMUSCULAR | Status: AC
  Filled 2021-12-12: qty 1

## 2021-12-12 MED ORDER — ROCURONIUM BROMIDE 10 MG/ML (PF) SYRINGE
PREFILLED_SYRINGE | INTRAVENOUS | Status: AC
  Filled 2021-12-12: qty 10

## 2021-12-12 MED ORDER — DEXMEDETOMIDINE (PRECEDEX) IN NS 20 MCG/5ML (4 MCG/ML) IV SYRINGE
PREFILLED_SYRINGE | INTRAVENOUS | Status: DC | PRN
  Administered 2021-12-12: 4 ug via INTRAVENOUS
  Administered 2021-12-12: 8 ug via INTRAVENOUS

## 2021-12-12 MED ORDER — LACTATED RINGERS IV SOLN: INTRAVENOUS | Status: DC

## 2021-12-12 MED ORDER — PROPOFOL 10 MG/ML IV BOLUS
INTRAVENOUS | Status: DC | PRN
  Administered 2021-12-12: 140 mg via INTRAVENOUS
  Administered 2021-12-12: 30 mg via INTRAVENOUS

## 2021-12-12 MED ORDER — BUPIVACAINE-EPINEPHRINE (PF) 0.5% -1:200000 IJ SOLN
INTRAMUSCULAR | Status: DC | PRN
  Administered 2021-12-12: 6 mL

## 2021-12-12 MED ORDER — PHENYLEPHRINE HCL-NACL 20-0.9 MG/250ML-% IV SOLN
INTRAVENOUS | Status: AC
  Filled 2021-12-12: qty 250

## 2021-12-12 MED ORDER — SENNA 8.6 MG PO TABS: 1.0000 | ORAL_TABLET | Freq: Every day | ORAL | 0 refills | Status: AC | PRN

## 2021-12-12 MED ORDER — ONDANSETRON HCL 4 MG/2ML IJ SOLN
INTRAMUSCULAR | Status: DC | PRN
  Administered 2021-12-12: 4 mg via INTRAVENOUS

## 2021-12-12 MED ORDER — ACETAMINOPHEN 10 MG/ML IV SOLN
INTRAVENOUS | Status: DC | PRN
Start: 2021-12-12 — End: 2021-12-12
  Administered 2021-12-12: 1000 mg via INTRAVENOUS

## 2021-12-12 MED ORDER — MIDAZOLAM HCL 2 MG/2ML IJ SOLN
INTRAMUSCULAR | Status: DC | PRN
  Administered 2021-12-12: 2 mg via INTRAVENOUS

## 2021-12-12 MED ORDER — LIDOCAINE HCL (CARDIAC) PF 100 MG/5ML IV SOSY
PREFILLED_SYRINGE | INTRAVENOUS | Status: DC | PRN
  Administered 2021-12-12: 60 mg via INTRAVENOUS

## 2021-12-12 MED ORDER — 0.9 % SODIUM CHLORIDE (POUR BTL) OPTIME
TOPICAL | Status: DC | PRN
  Administered 2021-12-12: 100 mL

## 2021-12-12 MED ORDER — SUCCINYLCHOLINE CHLORIDE 200 MG/10ML IV SOSY
PREFILLED_SYRINGE | INTRAVENOUS | Status: DC | PRN
  Administered 2021-12-12: 80 mg via INTRAVENOUS

## 2021-12-12 MED ORDER — LACTATED RINGERS IV SOLN: INTRAVENOUS | Status: DC | PRN

## 2021-12-12 MED ORDER — CHLORHEXIDINE GLUCONATE 0.12 % MT SOLN
OROMUCOSAL | Status: AC
Start: 2021-12-12 — End: 2021-12-12
  Administered 2021-12-12: 15 mL via OROMUCOSAL
  Filled 2021-12-12: qty 15

## 2021-12-12 MED ORDER — GABAPENTIN 400 MG PO CAPS
ORAL_CAPSULE | ORAL | Status: AC
Start: 2021-12-12 — End: 2021-12-12
  Administered 2021-12-12: 400 mg via ORAL
  Filled 2021-12-12: qty 1

## 2021-12-12 MED ORDER — VANCOMYCIN HCL IN DEXTROSE 1-5 GM/200ML-% IV SOLN
INTRAVENOUS | Status: AC
  Filled 2021-12-12: qty 200

## 2021-12-12 MED ORDER — VANCOMYCIN HCL IN DEXTROSE 1-5 GM/200ML-% IV SOLN
1000.0000 mg | Freq: Once | INTRAVENOUS | Status: AC
  Administered 2021-12-12: 1000 mg via INTRAVENOUS

## 2021-12-12 MED ORDER — BUPIVACAINE-EPINEPHRINE (PF) 0.5% -1:200000 IJ SOLN
INTRAMUSCULAR | Status: AC
  Filled 2021-12-12: qty 30

## 2021-12-12 MED ORDER — LIDOCAINE HCL (PF) 2 % IJ SOLN
INTRAMUSCULAR | Status: AC
  Filled 2021-12-12: qty 5

## 2021-12-12 MED ORDER — HYDROMORPHONE HCL 1 MG/ML IJ SOLN
0.2500 mg | INTRAMUSCULAR | Status: DC | PRN
  Administered 2021-12-12 (×2): 0.5 mg via INTRAVENOUS

## 2021-12-12 MED ORDER — CEFAZOLIN SODIUM-DEXTROSE 2-4 GM/100ML-% IV SOLN
INTRAVENOUS | Status: AC
  Filled 2021-12-12: qty 100

## 2021-12-12 MED ORDER — FAMOTIDINE 20 MG PO TABS: 20.0000 mg | ORAL_TABLET | Freq: Once | ORAL | Status: AC

## 2021-12-12 SURGICAL SUPPLY — 49 items
ADH SKN CLS APL DERMABOND .7 (GAUZE/BANDAGES/DRESSINGS) ×1
AGENT HMST KT MTR STRL THRMB (HEMOSTASIS) ×1
APL PRP STRL LF DISP 70% ISPRP (MISCELLANEOUS) ×2
BUR NEURO DRILL SOFT 3.0X3.8M (BURR) ×2 IMPLANT
CHLORAPREP W/TINT 26 (MISCELLANEOUS) ×4 IMPLANT
COUNTER NEEDLE 20/40 LG (NEEDLE) ×2 IMPLANT
DERMABOND ADVANCED (GAUZE/BANDAGES/DRESSINGS) ×1
DERMABOND ADVANCED .7 DNX12 (GAUZE/BANDAGES/DRESSINGS) ×1 IMPLANT
DISC MOBI-C CERVICAL 13X15 H5 (Miscellaneous) ×1 IMPLANT
DRAPE C ARM PK CFD 31 SPINE (DRAPES) ×2 IMPLANT
DRAPE LAPAROTOMY 77X122 PED (DRAPES) ×2 IMPLANT
DRAPE MICROSCOPE SPINE 48X150 (DRAPES) ×2 IMPLANT
DRAPE SURG 17X11 SM STRL (DRAPES) ×8 IMPLANT
ELECT CAUTERY BLADE TIP 2.5 (TIP) ×2
ELECT REM PT RETURN 9FT ADLT (ELECTROSURGICAL) ×2
ELECTRODE CAUTERY BLDE TIP 2.5 (TIP) ×1 IMPLANT
ELECTRODE REM PT RTRN 9FT ADLT (ELECTROSURGICAL) ×1 IMPLANT
FEE INTRAOP CADWELL SUPPLY NCS (MISCELLANEOUS) ×1 IMPLANT
GAUZE 4X4 16PLY ~~LOC~~+RFID DBL (SPONGE) ×2 IMPLANT
GLOVE SURG SYN 6.5 ES PF (GLOVE) ×2 IMPLANT
GLOVE SURG SYN 6.5 PF PI (GLOVE) ×1 IMPLANT
GLOVE SURG SYN 8.5  E (GLOVE) ×4
GLOVE SURG SYN 8.5 E (GLOVE) ×4 IMPLANT
GLOVE SURG SYN 8.5 PF PI (GLOVE) ×3 IMPLANT
GLOVE SURG UNDER POLY LF SZ6.5 (GLOVE) ×2 IMPLANT
GOWN SRG LRG LVL 4 IMPRV REINF (GOWNS) ×1 IMPLANT
GOWN SRG XL LVL 3 NONREINFORCE (GOWNS) ×1 IMPLANT
GOWN STRL NON-REIN TWL XL LVL3 (GOWNS) ×4
GOWN STRL REIN LRG LVL4 (GOWNS) ×2
GRADUATE 1200CC STRL 31836 (MISCELLANEOUS) ×2 IMPLANT
INTRAOP CADWELL SUPPLY FEE NCS (MISCELLANEOUS) ×1
INTRAOP DISP SUPPLY FEE NCS (MISCELLANEOUS) ×2
KIT TURNOVER KIT A (KITS) ×2 IMPLANT
MANIFOLD NEPTUNE II (INSTRUMENTS) ×2 IMPLANT
MARKER SKIN DUAL TIP RULER LAB (MISCELLANEOUS) ×4 IMPLANT
NEEDLE HYPO 22GX1.5 SAFETY (NEEDLE) ×2 IMPLANT
NS IRRIG 1000ML POUR BTL (IV SOLUTION) ×2 IMPLANT
PACK LAMINECTOMY NEURO (CUSTOM PROCEDURE TRAY) ×2 IMPLANT
PAD ARMBOARD 7.5X6 YLW CONV (MISCELLANEOUS) ×2 IMPLANT
PIN CASPAR SPINAL 12MM (PIN) ×1 IMPLANT
SPONGE KITTNER 5P (MISCELLANEOUS) ×2 IMPLANT
SURGIFLO W/THROMBIN 8M KIT (HEMOSTASIS) ×2 IMPLANT
SUT V-LOC 90 ABS DVC 3-0 CL (SUTURE) ×2 IMPLANT
SUT VIC AB 3-0 SH 8-18 (SUTURE) ×2 IMPLANT
SYR 30ML LL (SYRINGE) ×2 IMPLANT
TAPE CLOTH 3X10 WHT NS LF (GAUZE/BANDAGES/DRESSINGS) ×2 IMPLANT
TOWEL OR 17X26 4PK STRL BLUE (TOWEL DISPOSABLE) ×6 IMPLANT
TUBING CONNECTING 10 (TUBING) ×2 IMPLANT
WATER STERILE IRR 500ML POUR (IV SOLUTION) ×2 IMPLANT

## 2021-12-12 NOTE — Discharge Instructions (Addendum)
AMBULATORY SURGERY  ?DISCHARGE INSTRUCTIONS ? ? ?The drugs that you were given will stay in your system until tomorrow so for the next 24 hours you should not: ? ?Drive an automobile ?Make any legal decisions ?Drink any alcoholic beverage ? ? ?You may resume regular meals tomorrow.  Today it is better to start with liquids and gradually work up to solid foods. ? ?You may eat anything you prefer, but it is better to start with liquids, then soup and crackers, and gradually work up to solid foods. ? ? ?Please notify your doctor immediately if you have any unusual bleeding, trouble breathing, redness and pain at the surgery site, drainage, fever, or pain not relieved by medication. ? ?Your surgeon has performed an operation on your cervical spine (neck) to relieve pressure on the spinal cord and/or nerves. This involved making an incision in the front of your neck and removing one or more of the discs that support your spine. Next, a small piece of bone, a titanium plate, and screws were used to fuse two or more of the vertebrae (bones) together. ? ?The following are instructions to help in your recovery once you have been discharged from the hospital. Even if you feel well, it is important that you follow these activity guidelines. If you do not let your neck heal properly from the surgery, you can increase the chance of return of your symptoms and other complications. ? ? ?Activity  ?  ?No bending, lifting, or twisting (?BLT?). Avoid lifting objects heavier than 10 pounds (gallon milk jug).  Where possible, avoid household activities that involve lifting, bending, reaching, pushing, or pulling such as laundry, vacuuming, grocery shopping, and childcare. Try to arrange for help from friends and family for these activities while your back heals. ? ?Increase physical activity slowly as tolerated.  Taking short walks is encouraged, but avoid strenuous exercise. Do not jog, run, bicycle, lift weights, or participate in  any other exercises unless specifically allowed by your doctor. ? ?Talk to your doctor before resuming sexual activity. ? ?You should not drive until cleared by your doctor. ? ?Until released by your doctor, you should not return to work or school.  You should rest at home and let your body heal.  ? ?You may shower three days after your surgery.  After showering, lightly dab your incision dry. Do not take a tub bath or go swimming until approved by your doctor at your follow-up appointment. ? ?If you smoke, we strongly recommend that you quit.  Smoking has been proven to interfere with normal bone healing and will dramatically reduce the success rate of your surgery. Please contact [facility] (800-QUIT-NOW) and use the resources at www.[facility].com for assistance in stopping smoking. ? ?Surgical Incision ?  ?Keep your incision area clean and dry. ? ?Your incision was closed with Dermabond glue. Theglue should begin to peel away within about a week. ? ?Diet          ? ?You may return to your usual diet. However, you may experience discomfort when swallowing in the first month after your surgery. This is normal. You may find that softer foods are more comfortable for you to swallow. Be sure to stay hydrated. ? ?When to Contact us ? ?You may experience pain in your neck and/or pain between your shoulder blades. This is normal and should improve in the next few weeks with the help of pain medication, muscle relaxers, and rest. Some patients report that a warm compress on  the back of the neck or between the shoulder blades helps. ? ?However, should you experience any of the following, contact us immediately: ?New numbness or weakness ?Pain that is progressively getting worse, and is not relieved by your pain medication, muscle relaxers, rest, and warm compresses ?Bleeding, redness, swelling, pain, or drainage from surgical incision ?Chills or flu-like symptoms ?Fever greater than 101.0 F (38.3 C) ?Inability to eat,  drink fluids, or take medications ?Problems with bowel or bladder functions ?Difficulty breathing or shortness of breath ?Warmth, tenderness, or swelling in your calf ?Contact Information ?During office hours (Monday-Friday 9 am to 5 pm), please call your physician at [phone] and ask for Sharlot Gowda ?After hours and weekends, please call [phone] and speak with the answering service, who will contact the doctor on call.  If that fails, call the [facility] Operator at [phone] and ask for the Neurosurgery Resident On Call  ?For a life-threatening emergency, call 911 ? ? ?Please contact your physician with any problems or Same Day Surgery at [phone], Monday through Friday 6 am to 4 pm, or [place] at [facility] number at [phone].  ? AMBULATORY SURGERY  ?DISCHARGE INSTRUCTIONS ? ? ?The drugs that you were given will stay in your system until tomorrow so for the next 24 hours you should not: ? ?Drive an automobile ?Make any legal decisions ?Drink any alcoholic beverage ? ? ?You may resume regular meals tomorrow.  Today it is better to start with liquids and gradually work up to solid foods. ? ?You may eat anything you prefer, but it is better to start with liquids, then soup and crackers, and gradually work up to solid foods. ? ? ?Please notify your doctor immediately if you have any unusual bleeding, trouble breathing, redness and pain at the surgery site, drainage, fever, or pain not relieved by medication. ? ? ? ?Additional Instructions: ? ? ? ? ? ? ? ?Please contact your physician with any problems or Same Day Surgery at [phone], Monday through Friday 6 am to 4 pm, or [place] at [facility] number at [phone].  ?

## 2021-12-12 NOTE — Transfer of Care (Signed)
Immediate Anesthesia Transfer of Care Note ? ?Patient: Tiffany Sherman ? ?Procedure(s) Performed: C5-6 ARTHROPLASTY ? ?Patient Location: PACU ? ?Anesthesia Type:General ? ?Level of Consciousness: oriented and drowsy ? ?Airway & Oxygen Therapy: Patient Spontanous Breathing and Patient connected to face mask oxygen ? ?Post-op Assessment: Report given to RN and Post -op Vital signs reviewed and stable ? ?Post vital signs: Reviewed and stable ? ?Last Vitals:  ?Vitals Value Taken Time  ?BP 109/80 12/12/21 0930  ?Temp 36.8 ?C 12/12/21 0928  ?Pulse 85 12/12/21 0931  ?Resp 10 12/12/21 0931  ?SpO2 99 % 12/12/21 0931  ?Vitals shown include unvalidated device data. ? ?Last Pain:  ?Vitals:  ? 12/12/21 0928  ?TempSrc:   ?PainSc: Asleep  ?   ? ?  ? ?Complications: No notable events documented. ?

## 2021-12-12 NOTE — Anesthesia Procedure Notes (Signed)
Procedure Name: Intubation ?Date/Time: 04/17/2022 7:26 AM ?Performed by: Loletha Grayer, CRNA ?Pre-anesthesia Checklist: Patient identified, Patient being monitored, Timeout performed, Emergency Drugs available and Suction available ?Patient Re-evaluated:Patient Re-evaluated prior to induction ?Oxygen Delivery Method: Circle system utilized ?Preoxygenation: Pre-oxygenation with 100% oxygen ?Induction Type: IV induction ?Ventilation: Mask ventilation without difficulty ?Laryngoscope Size: 3 and McGraph ?Grade View: Grade I ?Tube type: Oral ?Tube size: 7.0 mm ?Number of attempts: 1 ?Airway Equipment and Method: Stylet ?Placement Confirmation: ETT inserted through vocal cords under direct vision, positive ETCO2 and breath sounds checked- equal and bilateral ?Secured at: 21 cm ?Tube secured with: Tape ?Dental Injury: Teeth and Oropharynx as per pre-operative assessment  ?Comments: Placed by Lucita Ferrara, SRNA under MDA and CRNA. Mcgrath used due to lack of neck mobility and pain in neck ? ? ? ? ?

## 2021-12-12 NOTE — H&P (Signed)
I have reviewed and confirmed my history and physical from 11/28/2021 with no additions or changes. Plan for C5-6 arthroplasty.  Risks and benefits reviewed. ? ?Heart sounds normal no MRG. Chest Clear to Auscultation Bilaterally. ? ? ?  ? ?

## 2021-12-12 NOTE — Anesthesia Preprocedure Evaluation (Signed)
Anesthesia Evaluation  ?Patient identified by MRN, date of birth, ID band ?Patient awake ? ? ? ?Reviewed: ?Allergy & Precautions, NPO status , Patient's Chart, lab work & pertinent test results ? ?History of Anesthesia Complications ?Negative for: history of anesthetic complications ? ?Airway ?Mallampati: II ? ? ? ? ? ? Dental ?no notable dental hx. ? ?  ?Pulmonary ?neg sleep apnea, neg COPD, Current SmokerPatient did not abstain from smoking.,  ?  ?Pulmonary exam normal ? ? ? ? ? ? ? Cardiovascular ?Exercise Tolerance: Good ?(-) hypertension(-) Past MI and (-) CHF Normal cardiovascular exam(-) dysrhythmias (-) Valvular Problems/Murmurs ? ? ?  ?Neuro/Psych ?neg Seizures PSYCHIATRIC DISORDERS Blunted Affect ?ADHD  ? GI/Hepatic ?neg GERD  ,(+)  ?  ? substance abuse ? marijuana use,   ?Endo/Other  ?neg diabetes ? Renal/GU ?negative Renal ROS  ? ?  ?Musculoskeletal ? ? Abdominal ?Normal abdominal exam  (+)   ?Peds ? Hematology ?  ?Anesthesia Other Findings ? ? Reproductive/Obstetrics ? ?  ? ? ? ? ? ? ? ? ? ? ? ? ? ?  ?  ? ? ? ? ? ? ? ? ?Anesthesia Physical ? ?Anesthesia Plan ? ?ASA: II ? ?Anesthesia Plan: General  ? ?Post-op Pain Management: Regional block*, Ofirmev IV (intra-op)* and Dilaudid IV  ? ?Induction: Intravenous ? ?PONV Risk Score and Plan: 2 and Ondansetron, Dexamethasone and Midazolam ? ?Airway Management Planned: Oral ETT ? ?Additional Equipment:  ? ?Intra-op Plan:  ? ?Post-operative Plan: Extubation in OR ? ?Informed Consent: I have reviewed the patients History and Physical, chart, labs and discussed the procedure including the risks, benefits and alternatives for the proposed anesthesia with the patient or authorized representative who has indicated his/her understanding and acceptance.  ? ? ? ? ? ?Plan Discussed with: CRNA and Anesthesiologist ? ?Anesthesia Plan Comments:   ? ? ? ? ? ? ?Anesthesia Quick Evaluation ? ?

## 2021-12-12 NOTE — Op Note (Signed)
Indications: Tiffany Sherman is a 26 yo female who presented with Cervical radiculopathy M54.12.  She had weakness on presentation prompting surgical intervention ? ?Findings: disc herniation C5-6 ? ?Preoperative Diagnosis: Cervical radiculopathy M54.12 ?Postoperative Diagnosis: same ? ? ?EBL: 10 ml ?IVF: see AR ml ?Drains: none ?Disposition: Extubated and Stable to PACU ?Complications: none ? ?No foley catheter was placed. ? ? ?Preoperative Note:  ? ?Risks of surgery discussed include: infection, bleeding, stroke, coma, death, paralysis, CSF leak, nerve/spinal cord injury, numbness, tingling, weakness, complex regional pain syndrome, recurrent stenosis and/or disc herniation, vascular injury, development of instability, neck/back pain, need for further surgery, persistent symptoms, development of deformity, and the risks of anesthesia. The patient understood these risks and agreed to proceed. ? ?Operative Note:  ?Procedure:  ?1) Cervical Disc Arthroplasty at C5/6 using a LDR Mobi-C device ?  ?Procedure: After obtaining informed consent, the patient taken to the operating room, placed in supine position, general anesthesia induced.  The patient had a small shoulder roll placed behind the neck.  The patient received preop antibiotics and IV Decadron.  The patient had a neck incision outlined, was prepped and draped in usual sterile fashion. The incision was injected with local anesthetic.   An incision was opened, dissection taken down medial to the carotid artery and jugular vein, lateral to the trachea and esophagus.  The prevertebral fascia identified and a localizing x-ray demonstrated the correct level.  The longus colli were dissected laterally, and self-retaining retractors placed to open the operative field. The microscope was then brought into the field. ? ?With this complete, distractor pins were placed in the vertebral bodies of C5 and C6. The distractor was placed, and the anulus at C5/6 was opened using a  bovie.  Curettes and pituitary rongeurs used to remove the majority of disk, then the drill was used to remove the posterior osteophyte and begin the foraminotomies. The nerve hook was used to elevate the posterior longitudinal ligament, which was then removed with Kerrison rongeurs. The microblunt nerve hook could be passed out the foramen bilaterally.   Meticulous hemostasis was obtained.  A trial spacer was used to size the disc space. Using flouroscopic guidance, a 15 mm width x 15 mm depth x 5 mm height Mobi-C was then inserted in the prepared disc space. ? ?The caspar distractor was removed, and bone wax used for hemostasis. Final AP and lateral radiographs were taken.  ? ?With the disc arthroplasty in good position, the wound was irrigated copiously with bacitracin-containing solution and meticulous hemostasis obtained.  Wound was closed in 2 layers using interrupted inverted 3-0 Vicryl sutures.  The wound was dressed with dermabond, the head of bed at 30 degrees, taken to recovery room in stable condition.  No new postop neurological deficits were identified. ? ?Sponge and pattie counts were correct at the end of the procedure.  ? ? ? ?I performed the entire procedure with the assistance of Tiffany Charity PA as an Designer, television/film set. ? ?Venetia Night MD ? ? ? ?

## 2021-12-12 NOTE — Discharge Summary (Signed)
Physician Discharge Summary  ?Patient ID: ?Tiffany Sherman ?MRN: [id number] ?DOB/AGE: Mar 19, 1996 26 y.o. ? ?Admit date: 12/12/2021 ?Discharge date: 12/12/2021 ? ?Admission Diagnoses: cervical radiculopathy  ? ?Discharge Diagnoses:  ?Active Problems: ?  * No active hospital problems. * ? ? ?Discharged Condition: good ? ?Hospital Course:  ?Tiffany Sherman is a 26 y.o s/p C5-6 arthroplasty. Her interoperative course was uncomplicated. She was monitored post-op for 4 hours and was discharged home after ambulating, urinating, and tolerating PO intake. She was given medications for pain, muscle relaxation and stool softener.  ? ?Consults: None ? ?Significant Diagnostic Studies: none ? ?Treatments: surgery: as above. Please see separately dictated operative report for further details ? ?Discharge Exam: ?Blood pressure 124/70, pulse 76, temperature 98.6 ?F (37 ?C), temperature source Tympanic, resp. rate 18, height 5\' 8"  (1.727 m), weight 60.8 kg, last menstrual period 12/03/2021, SpO2 99 %. ?CN II-XII grossly intact ?Trachea midline ?5/5 throughout BUE ?Incision c/d/I with dermabond in place.  ? ?Disposition: Discharge disposition: 01-Home or Self Care ? ? ? ? ? ? ? ?Allergies as of 12/12/2021   ? ?   Reactions  ? Bee Venom Anaphylaxis  ? Calamine   ? Makes the poison ivy worse   ? Latex Rash  ? ?  ? ?  ?Medication List  ?  ? ?STOP taking these medications   ? ?methylPREDNISolone 4 MG Tbpk tablet ?Commonly known as: MEDROL DOSEPAK ?  ?tiZANidine 2 MG tablet ?Commonly known as: ZANAFLEX ?Replaced by: tiZANidine 4 MG capsule ?  ? ?  ? ?TAKE these medications   ? ?celecoxib 100 MG capsule ?Commonly known as: CeleBREX ?Take 1 capsule (100 mg total) by mouth 2 (two) times daily. ?  ?gabapentin 300 MG capsule ?Commonly known as: NEURONTIN ?Take 300 mg by mouth daily. ?  ?Norgestimate-Ethinyl Estradiol Triphasic 0.18/0.215/0.25 MG-35 MCG tablet ?Commonly known as: Ortho Tri-Cyclen (28) ?Dispense one package to take 1 po daily at the  same time each day.  Patient may have 12 refills. ?  ?oxyCODONE-acetaminophen 5-325 MG tablet ?Commonly known as: Percocet ?Take 1 tablet by mouth every 4 (four) hours as needed for severe pain. ?What changed: when to take this ?  ?senna 8.6 MG Tabs tablet ?Commonly known as: SENOKOT ?Take 1 tablet (8.6 mg total) by mouth daily as needed for mild constipation. ?  ?tiZANidine 4 MG capsule ?Commonly known as: Zanaflex ?Take 1 capsule (4 mg total) by mouth 3 (three) times daily as needed for muscle spasms. ?Replaces: tiZANidine 2 MG tablet ?  ? ?  ? ? Follow-up Information   ? ? Loleta Dicker, PA Follow up in 2 week(s).   ?Why: for incision check and post-op follow up. This appointment date and time was provided on pre-operative paperwork ? ?follow-up appointment on April 6th at 3:30 pm. ?Contact information: ?[place] [address] ?[phone] ? ? ?  ?  ? ?  ?  ? ?  ? ? ?Signed: ?Loleta Dicker ?12/12/2021, 12:20 PM ? ? ?

## 2021-12-13 ENCOUNTER — Encounter: Payer: Self-pay | Admitting: Neurosurgery

## 2021-12-14 NOTE — Anesthesia Postprocedure Evaluation (Signed)
Anesthesia Post Note ? ?Patient: [name] ? ?Procedure(s) Performed: C5-6 ARTHROPLASTY ? ?Patient location during evaluation: PACU ?Anesthesia Type: General ?Level of consciousness: awake and alert ?Pain management: pain level controlled ?Vital Signs Assessment: post-procedure vital signs reviewed and stable ?Respiratory status: spontaneous breathing, nonlabored ventilation and respiratory function stable ?Cardiovascular status: blood pressure returned to baseline and stable ?Postop Assessment: no apparent nausea or vomiting ?Anesthetic complications: no ? ? ?No notable events documented. ? ? ?Last Vitals:  ?Vitals:  ? 12/12/21 1108 12/12/21 1323  ?BP: 124/70 117/75  ?Pulse: 76 75  ?Resp: 18 18  ?Temp: 37 ?C (!) 36.1 ?C  ?SpO2: 99% 100%  ?  ?Last Pain:  ?Vitals:  ? 12/13/21 0845  ?TempSrc:   ?PainSc: 0-No pain  ? ? ?  ?  ?  ?  ?  ?  ? ?Tiffany Sherman ? ? ? ? ?

## 2022-03-03 ENCOUNTER — Emergency Department: Payer: Medicaid Other

## 2022-03-03 ENCOUNTER — Other Ambulatory Visit: Payer: Self-pay

## 2022-03-03 ENCOUNTER — Encounter: Payer: Self-pay | Admitting: Emergency Medicine

## 2022-03-03 ENCOUNTER — Emergency Department
Admission: EM | Admit: 2022-03-03 | Discharge: 2022-03-03 | Disposition: A | Discharge status: Home / Self Care | Payer: Medicaid Other | Attending: Emergency Medicine | Admitting: Emergency Medicine

## 2022-03-03 DIAGNOSIS — M542 Cervicalgia: Secondary | ICD-10-CM | POA: Insufficient documentation

## 2022-03-03 DIAGNOSIS — N3000 Acute cystitis without hematuria: Secondary | ICD-10-CM | POA: Insufficient documentation

## 2022-03-03 DIAGNOSIS — R42 Dizziness and giddiness: Secondary | ICD-10-CM | POA: Diagnosis present

## 2022-03-03 DIAGNOSIS — M25512 Pain in left shoulder: Secondary | ICD-10-CM | POA: Insufficient documentation

## 2022-03-03 LAB — URINALYSIS, ROUTINE W REFLEX MICROSCOPIC
Bilirubin Urine: NEGATIVE
Glucose, UA: NEGATIVE mg/dL
Hgb urine dipstick: NEGATIVE
Ketones, ur: 5 mg/dL — AB
Nitrite: POSITIVE — AB
Protein, ur: 30 mg/dL — AB
Specific Gravity, Urine: 1.02 (ref 1.005–1.030)
pH: 7 (ref 5.0–8.0)

## 2022-03-03 LAB — CBC WITH DIFFERENTIAL/PLATELET
Abs Immature Granulocytes: 0.01 10*3/uL (ref 0.00–0.07)
Basophils Absolute: 0 10*3/uL (ref 0.0–0.1)
Basophils Relative: 1 %
Eosinophils Absolute: 0.1 10*3/uL (ref 0.0–0.5)
Eosinophils Relative: 3 %
HCT: 41.9 % (ref 36.0–46.0)
Hemoglobin: 13.3 g/dL (ref 12.0–15.0)
Immature Granulocytes: 0 %
Lymphocytes Relative: 46 %
Lymphs Abs: 2.6 10*3/uL (ref 0.7–4.0)
MCH: 29.3 pg (ref 26.0–34.0)
MCHC: 31.7 g/dL (ref 30.0–36.0)
MCV: 92.3 fL (ref 80.0–100.0)
Monocytes Absolute: 0.4 10*3/uL (ref 0.1–1.0)
Monocytes Relative: 8 %
Neutro Abs: 2.3 10*3/uL (ref 1.7–7.7)
Neutrophils Relative %: 42 %
Platelets: 247 10*3/uL (ref 150–400)
RBC: 4.54 MIL/uL (ref 3.87–5.11)
RDW: 12.8 % (ref 11.5–15.5)
WBC: 5.5 10*3/uL (ref 4.0–10.5)
nRBC: 0 % (ref 0.0–0.2)

## 2022-03-03 LAB — COMPREHENSIVE METABOLIC PANEL
ALT: 22 U/L (ref 0–44)
AST: 24 U/L (ref 15–41)
Albumin: 4.7 g/dL (ref 3.5–5.0)
Alkaline Phosphatase: 61 U/L (ref 38–126)
Anion gap: 7 (ref 5–15)
BUN: 12 mg/dL (ref 6–20)
CO2: 24 mmol/L (ref 22–32)
Calcium: 9.7 mg/dL (ref 8.9–10.3)
Chloride: 108 mmol/L (ref 98–111)
Creatinine, Ser: 0.59 mg/dL (ref 0.44–1.00)
GFR, Estimated: >60 mL/min (ref >60)
Glucose, Bld: 102 mg/dL — ABNORMAL HIGH (ref 70–99)
Potassium: 3.5 mmol/L (ref 3.5–5.1)
Sodium: 139 mmol/L (ref 135–145)
Total Bilirubin: 0.6 mg/dL (ref 0.3–1.2)
Total Protein: 8 g/dL (ref 6.5–8.1)

## 2022-03-03 LAB — HCG, QUANTITATIVE, PREGNANCY: hCG, Beta Chain, Quant, S: <1 m[IU]/mL (ref <5)

## 2022-03-03 LAB — LIPASE, BLOOD: Lipase: 29 U/L (ref 11–51)

## 2022-03-03 LAB — POC URINE PREG, ED: Preg Test, Ur: NEGATIVE

## 2022-03-03 MED ORDER — CEPHALEXIN 500 MG PO CAPS: 500.0000 mg | ORAL_CAPSULE | Freq: Four times a day (QID) | ORAL | 0 refills | Status: AC

## 2022-03-03 NOTE — ED Notes (Signed)
Lab is aware of hcg add-on. 

## 2022-03-03 NOTE — ED Provider Notes (Signed)
[facility] Provider Note  Patient Contact: 3:59 PM (approximate)   History   Dizziness   HPI  Tiffany Sherman is a 26 y.o. female status post cervical spine fusion on 01/24/2022 by Volanda Napoleon presents to the emergency department with multiple medical concerns.  Patient states that she has missed work for the past 2 to 3 days due to feeling under the weather.  She states that back in April, she was at a bar and consumed a shot of tequila and felt dizzy afterwards and had a fall.  She states that since fall occurred, she has had several unprovoked episodes of syncope.  Patient states that she waited to seek care as she has children and could not find the time to come to the emergency department.  She states that she still has residual weakness but that the pain in her neck and left shoulder has improved progressively since she had her shoulder surgery although she still has a fine tremor of the left upper extremity.  She denies chest pain, chest tightness or shortness of breath.      Physical Exam   Triage Vital Signs: ED Triage Vitals  Enc Vitals Group     BP 03/03/22 1532 (!) 134/94     Pulse Rate 03/03/22 1532 72     Resp 03/03/22 1532 (!) 181     Temp 03/03/22 1532 98.7 F (37.1 C)     Temp Source 03/03/22 1532 Oral     SpO2 03/03/22 1532 97 %     Weight 03/03/22 1527 132 lb 4.4 oz (60 kg)     Height 03/03/22 1527 5\' 8"  (1.727 m)     Head Circumference --      Peak Flow --      Pain Score 03/03/22 1527 0     Pain Loc --      Pain Edu? --      Excl. in GC? --     Most recent vital signs: Vitals:   03/03/22 1700 03/03/22 1800  BP: 130/90 (!) 132/94  Pulse: 70 72  Resp: 16 16  Temp:    SpO2: 97% 97%     General: Alert and in no acute distress. Eyes:  PERRL. EOMI. Head: No acute traumatic findings ENT:      Ears:       Nose: No congestion/rhinnorhea.      Mouth/Throat: Mucous membranes are moist. Neck: No stridor. No cervical  spine tenderness to palpation. Cardiovascular:  Good peripheral perfusion Respiratory: Normal respiratory effort without tachypnea or retractions. Lungs CTAB. Good air entry to the bases with no decreased or absent breath sounds. Gastrointestinal: Bowel sounds 4 quadrants. Soft and nontender to palpation. No guarding or rigidity. No palpable masses. No distention. No CVA tenderness. Musculoskeletal: Full range of motion to all extremities.  Neurologic:  No gross focal neurologic deficits are appreciated.  Skin:   No rash noted Other:   ED Results / Procedures / Treatments   Labs (all labs ordered are listed, but only abnormal results are displayed) Labs Reviewed  COMPREHENSIVE METABOLIC PANEL - Abnormal; Notable for the following components:      Result Value   Glucose, Bld 102 (*)    All other components within normal limits  URINALYSIS, ROUTINE W REFLEX MICROSCOPIC - Abnormal; Notable for the following components:   Color, Urine AMBER (*)    APPearance CLOUDY (*)    Ketones, ur 5 (*)    Protein, ur 30 (*)  Nitrite POSITIVE (*)    Leukocytes,Ua LARGE (*)    Bacteria, UA FEW (*)    All other components within normal limits  CBC WITH DIFFERENTIAL/PLATELET  LIPASE, BLOOD  HCG, QUANTITATIVE, PREGNANCY  POC URINE PREG, ED     EKG Normal sinus rhythm with sinus arrhythmia.  No ST segment elevation.Marland Kitchen    RADIOLOGY  I personally viewed and evaluated these images as part of my medical decision making, as well as reviewing the written report by the radiologist.  ED Provider Interpretation: I personally interpreted CT of the cervical spine without acute abnormality visualized.   PROCEDURES:  Critical Care performed: No  Procedures   MEDICATIONS ORDERED IN ED: Medications - No data to display   IMPRESSION / MDM / ASSESSMENT AND PLAN / ED COURSE  I reviewed the triage vital signs and the nursing notes.                              Differential diagnosis includes,  but is not limited to, pregnancy, concussion, electrolyte abnormality, new disc herniation...  Assessment and plan Dizziness:  26 year old female presents to the emergency department with dizziness and feeling "under the weather".  Patient also endorses possible syncope at home after alcoholic beverages were consumed.  Vital signs were reassuring at triage.  On exam, patient was alert, active and nontoxic-appearing.  Urinalysis concerning for UTI.  CBC, CMP, low lipase unremarkable.  Urine pregnancy test was negative.  CT of the cervical spine shows no acute abnormality.  EKG indicated normal sinus rhythm without ST segment elevation or other apparent arrhythmia.   FINAL CLINICAL IMPRESSION(S) / ED DIAGNOSES   Final diagnoses:  Dizziness  Acute cystitis without hematuria     Rx / DC Orders   ED Discharge Orders          Ordered    cephALEXin (KEFLEX) 500 MG capsule  4 times daily        03/03/22 1817             Note:  This document was prepared using Dragon voice recognition software and may include unintentional dictation errors.   Gasper Lloyd 03/03/22 2012    Sharman Cheek, MD 03/09/22 2324

## 2022-03-03 NOTE — ED Triage Notes (Signed)
Per WC profile, no UDS or blood collection needed.

## 2022-03-03 NOTE — ED Triage Notes (Signed)
Pt reports was injured in March at work, had surgery due to the injury and continues to have dizzy spells from it.

## 2022-03-03 NOTE — Discharge Instructions (Addendum)
Take Keflex 4 times daily for the next 7 days. 

## 2022-04-18 ENCOUNTER — Ambulatory Visit: Payer: Medicaid Other

## 2022-05-21 ENCOUNTER — Ambulatory Visit (LOCAL_COMMUNITY_HEALTH_CENTER): Payer: Medicaid Other

## 2022-05-21 VITALS — BP 112/72 | Ht 68.0 in | Wt 126.0 lb

## 2022-05-21 DIAGNOSIS — Z309 Encounter for contraceptive management, unspecified: Secondary | ICD-10-CM

## 2022-05-21 DIAGNOSIS — Z3202 Encounter for pregnancy test, result negative: Secondary | ICD-10-CM

## 2022-05-21 DIAGNOSIS — Z3009 Encounter for other general counseling and advice on contraception: Secondary | ICD-10-CM

## 2022-05-21 MED ORDER — LEVONORGESTREL 1.5 MG PO TABS: 1.5000 mg | ORAL_TABLET | Freq: Once | ORAL | 0 refills | Status: AC

## 2022-05-21 NOTE — Progress Notes (Signed)
Patient in clinic today for a PT.   PT negative today and desires Plan B today.  Prescription submitted to pharmacy.  Gregary Cromer, FNP   See pregnancy flow sheet and emergency contraception flow sheet.  Pt states nexplanon removed sometime in July by [facility].  Voiced several concerns including not having anywhere to live and all the stressors in her life right now.  Denied thoughts of harming herself.  Given Milton Ferguson, MSW, LCSW contact card.   States has case worker in [facility] and met with her yesterday.  Offered [facility] services and pt states she will followup with her provider if desires BCM.  Condoms given.   ECP instruction sheet given to pt and reviewed; Consent for ECP reviewed and signed by pt.   Tonny Branch, RN

## 2022-05-22 LAB — PREGNANCY, URINE: Preg Test, Ur: NEGATIVE

## 2022-05-29 ENCOUNTER — Ambulatory Visit: Payer: Medicaid Other

## 2022-07-05 ENCOUNTER — Encounter: Payer: Self-pay | Admitting: Obstetrics and Gynecology

## 2022-07-24 ENCOUNTER — Ambulatory Visit: Payer: Medicaid Other | Attending: Neurology | Admitting: Physical Therapy

## 2022-07-24 DIAGNOSIS — Z113 Encounter for screening for infections with a predominantly sexual mode of transmission: Secondary | ICD-10-CM | POA: Insufficient documentation

## 2022-07-24 DIAGNOSIS — R87612 Low grade squamous intraepithelial lesion on cytologic smear of cervix (LGSIL): Secondary | ICD-10-CM | POA: Insufficient documentation

## 2022-07-31 ENCOUNTER — Encounter: Payer: Medicaid Other | Admitting: Physical Therapy

## 2022-08-02 ENCOUNTER — Encounter: Payer: Medicaid Other | Admitting: Physical Therapy

## 2022-08-05 ENCOUNTER — Encounter: Payer: Medicaid Other | Admitting: Physical Therapy

## 2022-08-06 ENCOUNTER — Other Ambulatory Visit (HOSPITAL_COMMUNITY)
Admission: RE | Admit: 2022-08-06 | Discharge: 2022-08-06 | Disposition: A | Discharge status: Home / Self Care | Payer: Medicaid Other | Source: Ambulatory Visit | Attending: Obstetrics & Gynecology | Admitting: Obstetrics & Gynecology

## 2022-08-06 ENCOUNTER — Ambulatory Visit: Payer: Medicaid Other | Admitting: Obstetrics & Gynecology

## 2022-08-06 ENCOUNTER — Ambulatory Visit (INDEPENDENT_AMBULATORY_CARE_PROVIDER_SITE_OTHER): Payer: Medicaid Other | Admitting: Obstetrics & Gynecology

## 2022-08-06 ENCOUNTER — Encounter: Payer: Self-pay | Admitting: Obstetrics & Gynecology

## 2022-08-06 VITALS — Ht 68.0 in | Wt 134.0 lb

## 2022-08-06 DIAGNOSIS — Z113 Encounter for screening for infections with a predominantly sexual mode of transmission: Secondary | ICD-10-CM

## 2022-08-06 DIAGNOSIS — R87612 Low grade squamous intraepithelial lesion on cytologic smear of cervix (LGSIL): Secondary | ICD-10-CM | POA: Insufficient documentation

## 2022-08-06 NOTE — Progress Notes (Signed)
   New Patient Office Visit  Subjective   Patient ID: Tiffany Sherman, female    DOB: 05/10/1996  Age: 26 y.o. 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 She reports a h/o an abnormal pap but says that she never kept the appt for her colpo. Of note, she came here today about 30 minutes late for her appt.  She is sexually active and uses withdrawal for contraception. She had used Nexplanon until 04/2022 when she had it removed (she was considering a pregnancy with her "ex"). She is having sex with a different fellow at this time.  ROS- she is currently homeless, already hooked in with DSS and the appropriate services in [place].  Objective:     Ht 5\' 8"  (1.727 m)   Wt 134 lb (60.8 kg)   BMI 20.37 kg/m    Physical Exam  Well nourished, well hydrated White female, no apparent distress She is ambulating normally, She is eating pancakes from [employer] while talking with me, seems distracted.  UPT negative, consent signed, time out done Cervix prepped with acetic acid. Transformation zone seen in its entirety. Colpo adequate. 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  I biopsied this area and used silver nitrate to achieve hemostasis. ECC obtained. She tolerated the procedure well.  Assessment & Plan:  LGSIL pap- She will come back in a few weeks to discuss the pathology results. I have rec'd a MVI daily. Problem List Items Addressed This Visit   None   No follow-ups on file.    , MD

## 2022-08-07 ENCOUNTER — Encounter: Payer: Self-pay | Admitting: Obstetrics & Gynecology

## 2022-08-07 ENCOUNTER — Encounter: Payer: Medicaid Other | Admitting: Physical Therapy

## 2022-08-08 LAB — CERVICOVAGINAL ANCILLARY ONLY
Bacterial Vaginitis (gardnerella): POSITIVE — AB
Candida Glabrata: NEGATIVE
Candida Vaginitis: NEGATIVE
Chlamydia: NEGATIVE
Comment: NEGATIVE
Comment: NEGATIVE
Comment: NEGATIVE
Comment: NEGATIVE
Comment: NEGATIVE
Comment: NORMAL
Neisseria Gonorrhea: NEGATIVE
Trichomonas: POSITIVE — AB

## 2022-08-08 LAB — SURGICAL PATHOLOGY

## 2022-08-09 ENCOUNTER — Encounter: Payer: Self-pay | Admitting: Advanced Practice Midwife

## 2022-08-09 DIAGNOSIS — Z5900 Homelessness unspecified: Secondary | ICD-10-CM | POA: Insufficient documentation

## 2022-08-09 DIAGNOSIS — M5030 Other cervical disc degeneration, unspecified cervical region: Secondary | ICD-10-CM | POA: Insufficient documentation

## 2022-08-09 DIAGNOSIS — R87619 Unspecified abnormal cytological findings in specimens from cervix uteri: Secondary | ICD-10-CM | POA: Insufficient documentation

## 2022-08-09 HISTORY — DX: Unspecified abnormal cytological findings in specimens from cervix uteri: R87.619

## 2022-08-12 ENCOUNTER — Encounter: Payer: Self-pay | Admitting: Obstetrics & Gynecology

## 2022-08-12 ENCOUNTER — Encounter: Payer: Medicaid Other | Admitting: Physical Therapy

## 2022-08-12 ENCOUNTER — Other Ambulatory Visit: Payer: Self-pay | Admitting: Obstetrics & Gynecology

## 2022-08-12 MED ORDER — METRONIDAZOLE 500 MG PO TABS: 500.0000 mg | ORAL_TABLET | Freq: Two times a day (BID) | ORAL | 0 refills | Status: AC

## 2022-08-12 NOTE — Progress Notes (Signed)
Flagyl prescribed to treat trich and BV.

## 2022-08-19 ENCOUNTER — Encounter: Payer: Medicaid Other | Admitting: Physical Therapy

## 2022-08-22 ENCOUNTER — Encounter: Payer: Medicaid Other | Admitting: Physical Therapy

## 2022-08-23 ENCOUNTER — Ambulatory Visit: Payer: Medicaid Other | Admitting: Obstetrics and Gynecology

## 2022-08-27 ENCOUNTER — Ambulatory Visit: Payer: Medicaid Other | Admitting: Obstetrics & Gynecology

## 2022-08-27 ENCOUNTER — Encounter: Payer: Medicaid Other | Admitting: Physical Therapy

## 2022-08-29 ENCOUNTER — Encounter: Payer: Medicaid Other | Admitting: Physical Therapy

## 2022-09-03 ENCOUNTER — Encounter: Payer: Medicaid Other | Admitting: Physical Therapy

## 2022-09-05 ENCOUNTER — Encounter: Payer: Medicaid Other | Admitting: Physical Therapy

## 2022-12-24 ENCOUNTER — Ambulatory Visit: Payer: Medicaid Other | Admitting: Obstetrics and Gynecology

## 2023-07-17 ENCOUNTER — Telehealth: Payer: Self-pay | Admitting: Obstetrics & Gynecology

## 2023-07-17 NOTE — Telephone Encounter (Signed)
LM for patient to call office back to schedule annual appointment with MD patient is due after 08/07/23

## 2023-08-27 IMAGING — RF DG CERVICAL SPINE 2 OR 3 VIEWS
1 series · 6 of 6 positions shown · non-contrast
Comparison: MRI cervical spine 11/27/2021

CLINICAL DATA: C5-6 arthroplasty.  Fluoroscopy during surgery.

EXAM:
CERVICAL SPINE - 2-3 VIEW

[Series 1: dg x-ray · 0.20mm/px · 6 of 6 slices shown]
[im 1/6]
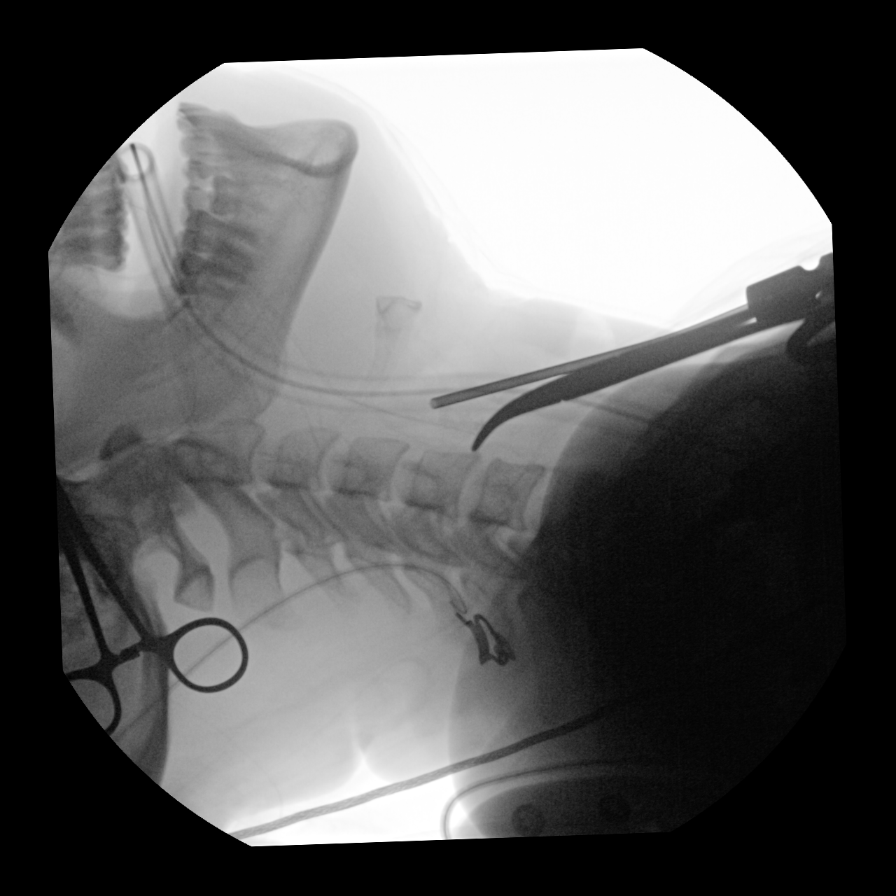
[im 2/6]
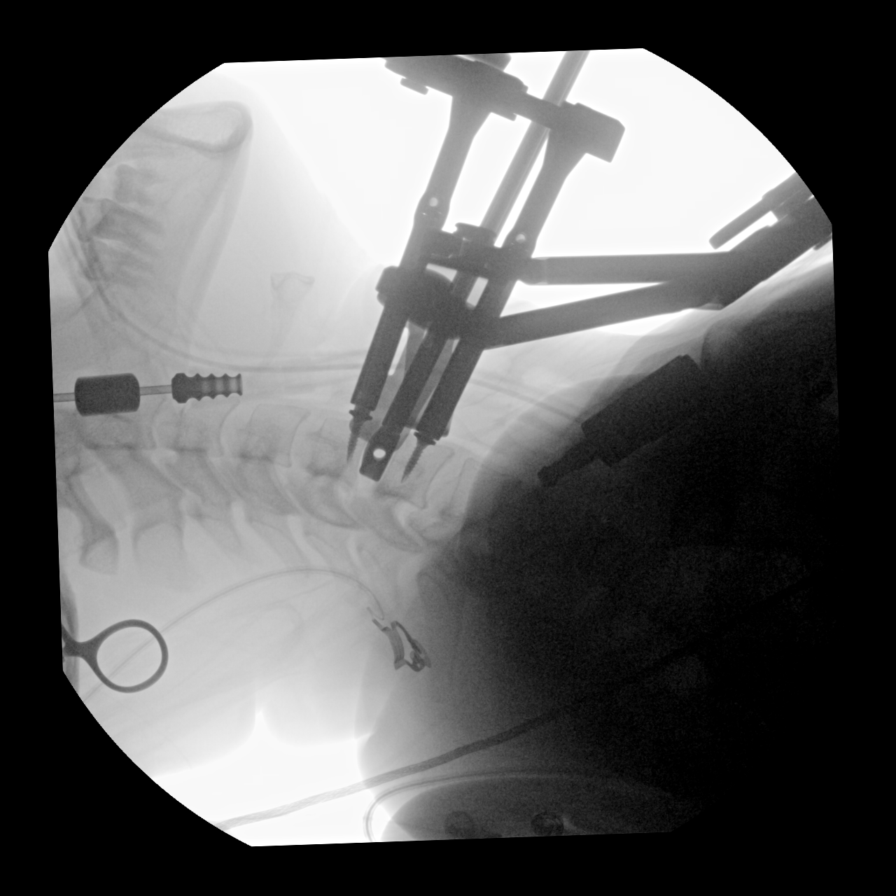
[im 3/6]
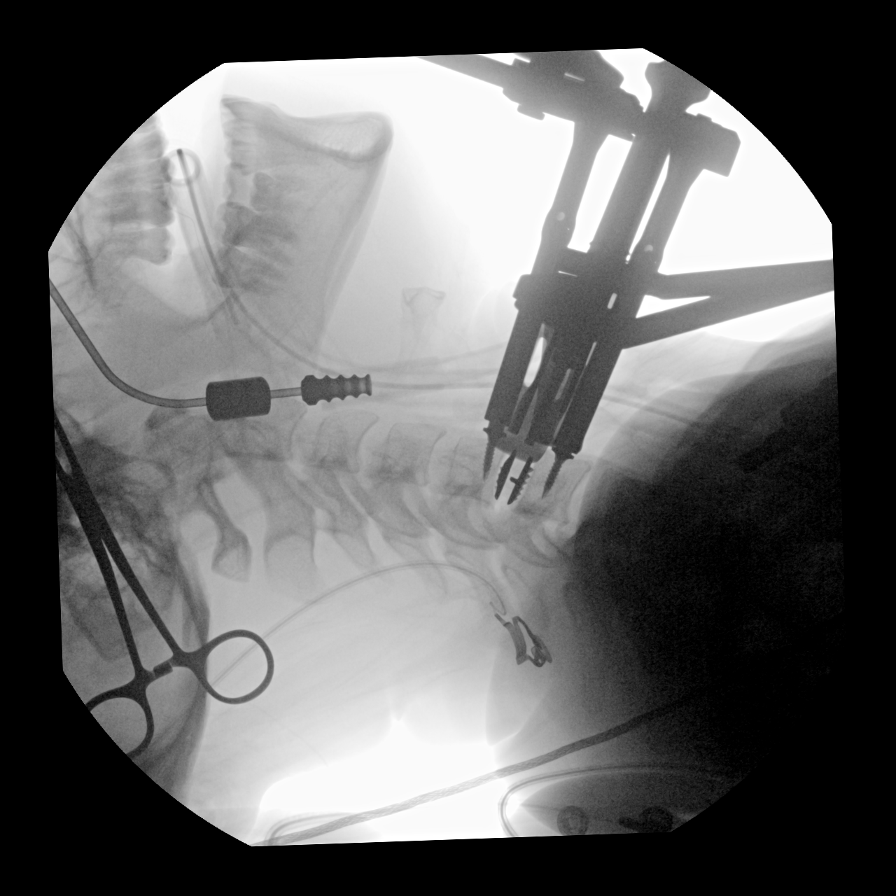
[im 4/6]
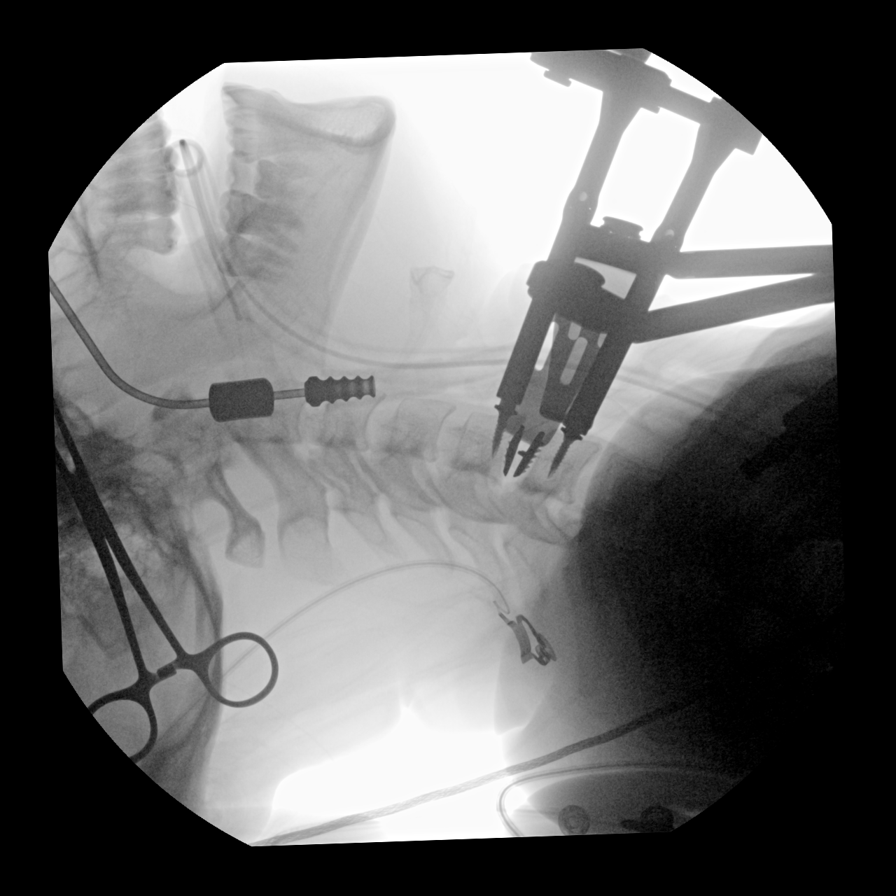
[im 5/6]
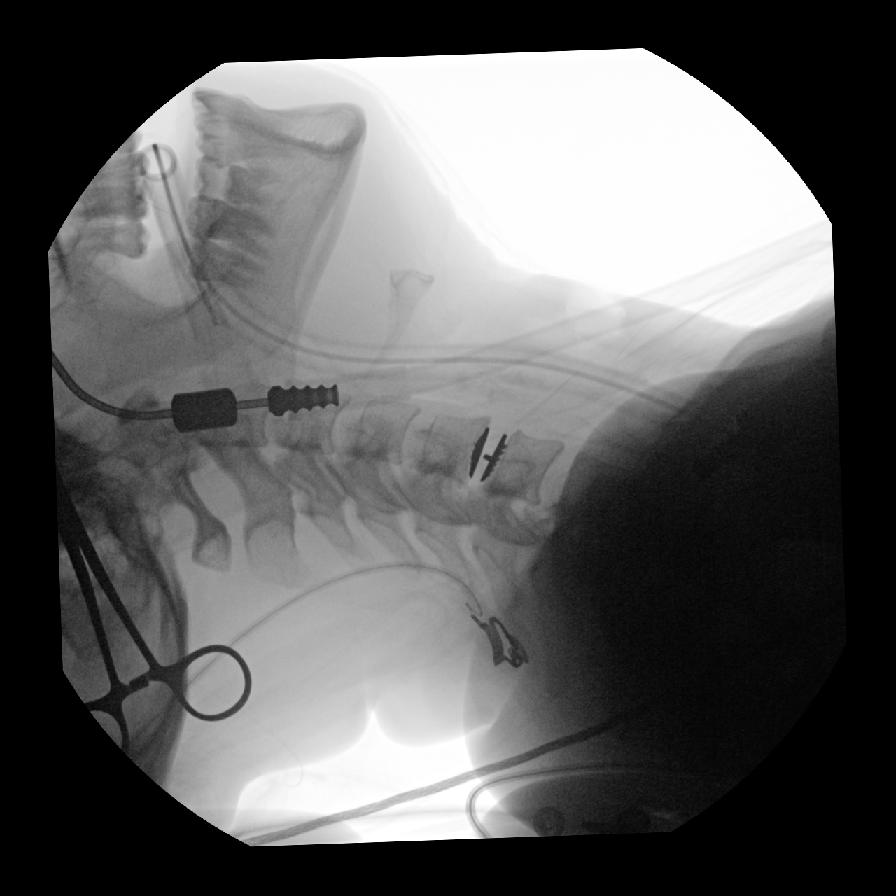
[im 6/6]
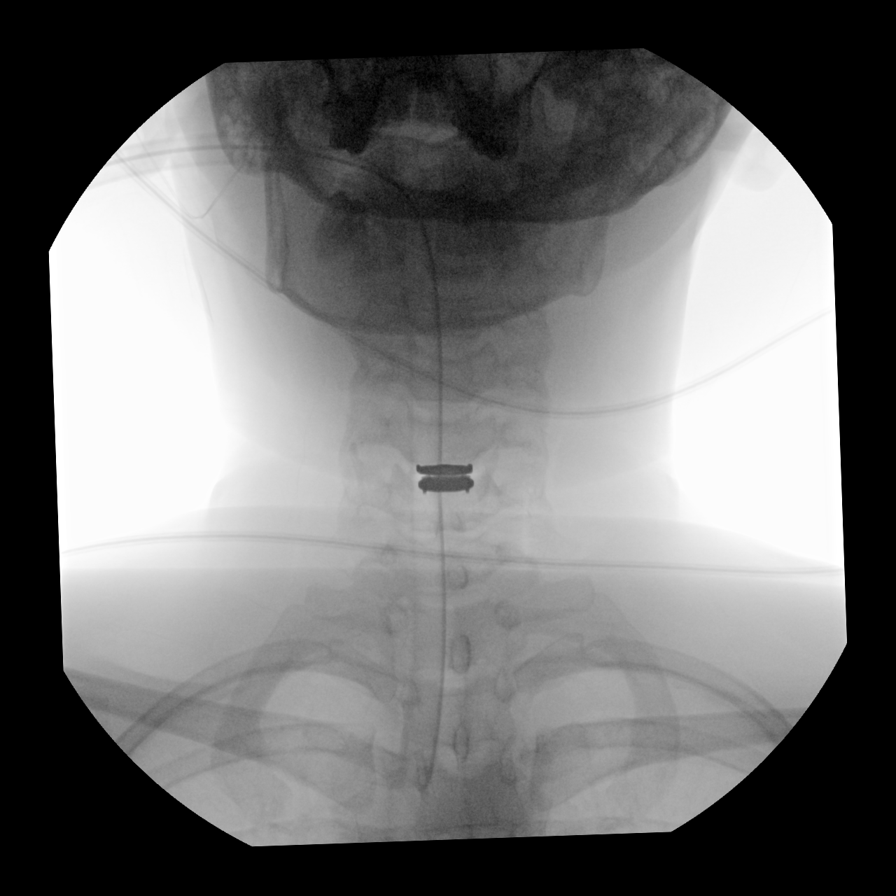

[6 of 6 positions shown; findings below may reference images not displayed]

FINDINGS: The first lateral view there is straightening of the normal cervical
lordosis. Visualization of C1 through C6 above the shoulder bones
and soft tissues with partial visualization of C7 through the
overlying bones and soft tissues. Surgical instrumentation from
anterior approach points to the anterior inferior aspect of the C5
vertebral body. On subsequent images, the patient appears to be
undergoing placement of a C5-6 intervertebral disc spacer.

Total fluoro time: 6 seconds.

Total fluoroscopy images: 6
IMPRESSION: Intraoperative fluoroscopy during C5-6 ACDF.

## 2023-11-16 IMAGING — CT CT CERVICAL SPINE W/O CM
3 of 4 series · 12 of 34 positions shown, 14 images · non-contrast
Comparison: 11/27/2021.

CLINICAL DATA: Pt reports was injured in [REDACTED] at work, had surgery
due to the injury and continues to have dizzy spells from it.



[Series 11: c spine soft · axial · 0.43mm/px · z∈[-267,-151]mm · 4 of 84 slices shown, 5 images]
[im 13/84  soft-tissue]
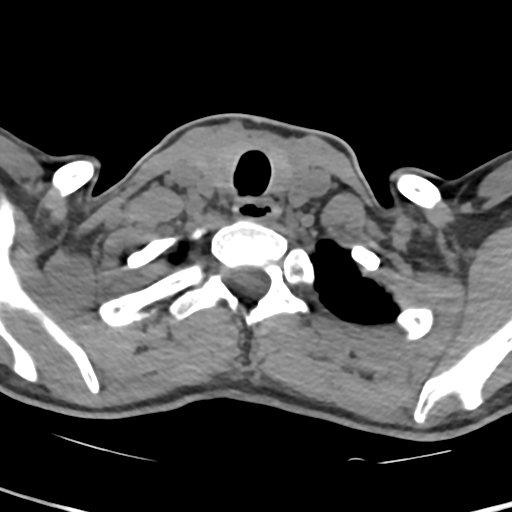
[im 13/84  bone]
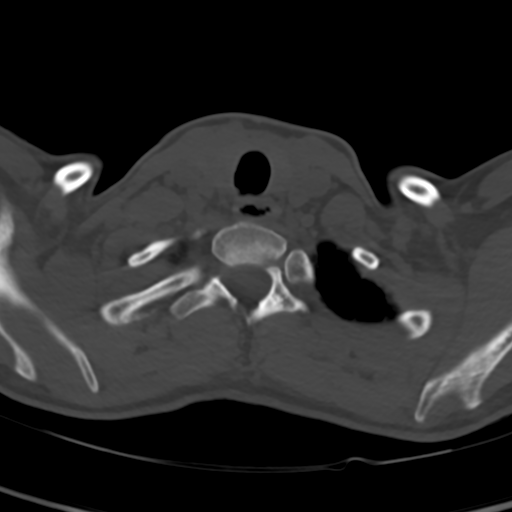
[im 32/84  bone]
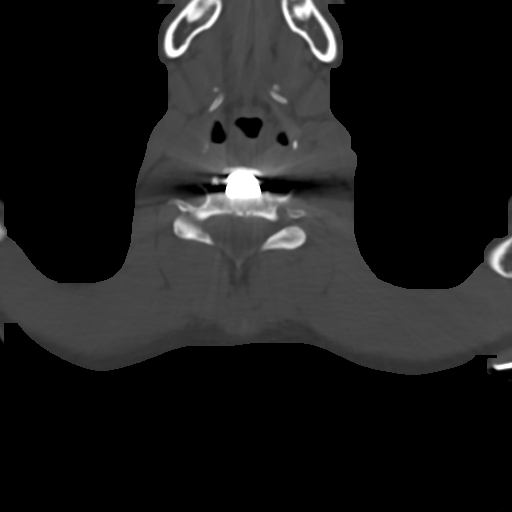
[im 52/84  bone]
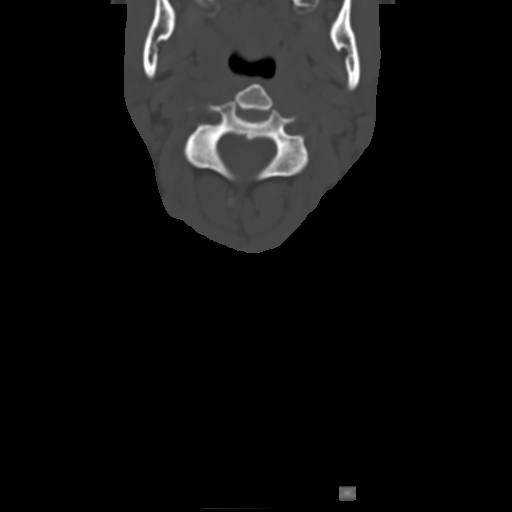
[im 71/84  bone]
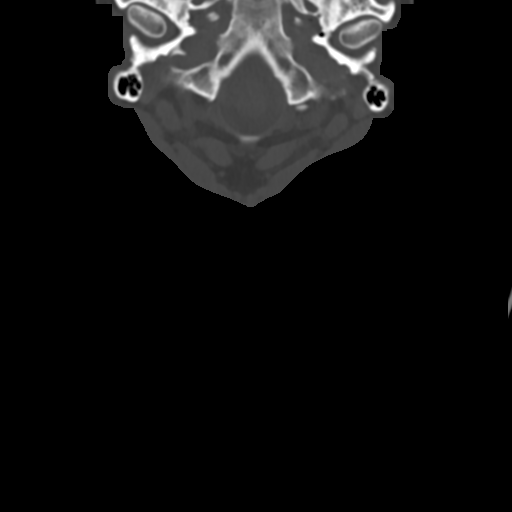

[Series 14: sag bone · sagittal · 0.37mm/px · 5 of 95 slices shown, 6 images]
[im 32/95  bone]
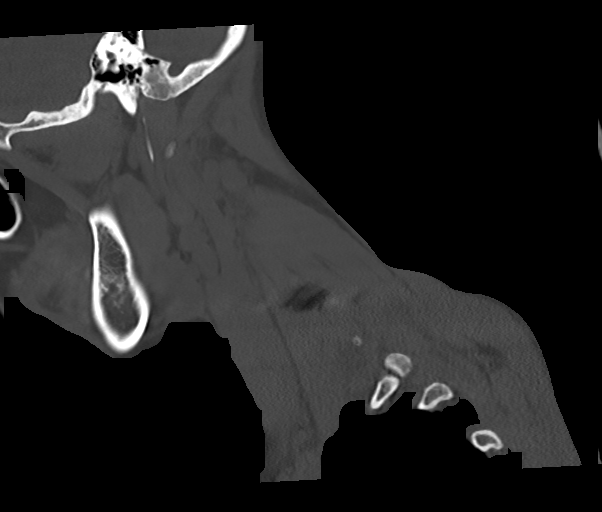
[im 40/95  bone]
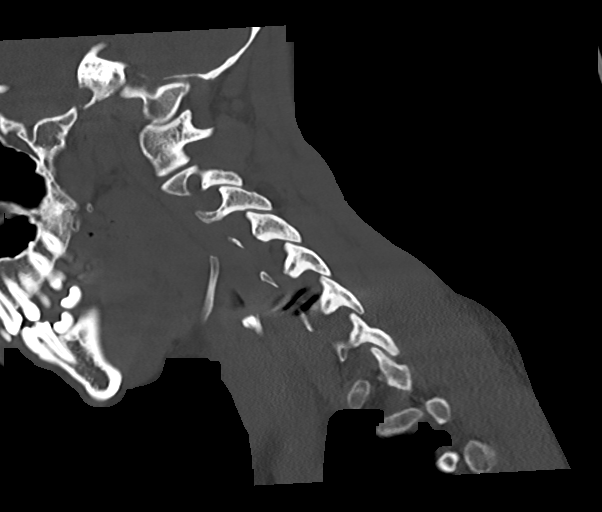
[im 48/95  soft-tissue]
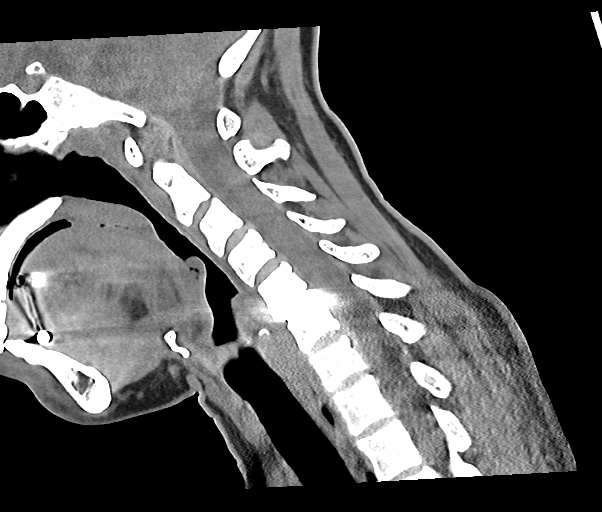
[im 48/95  bone]
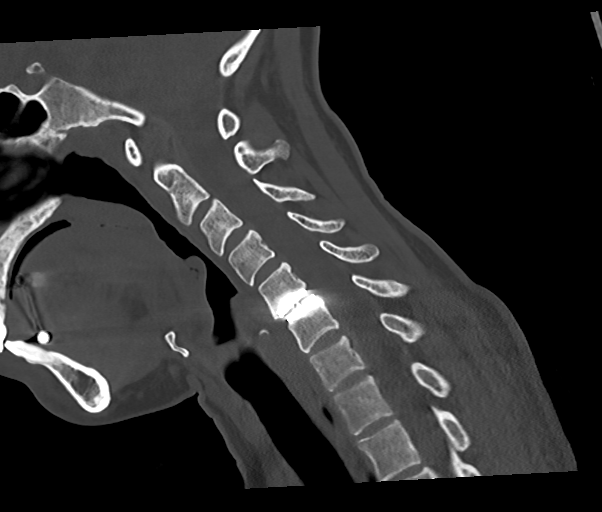
[im 55/95  bone]
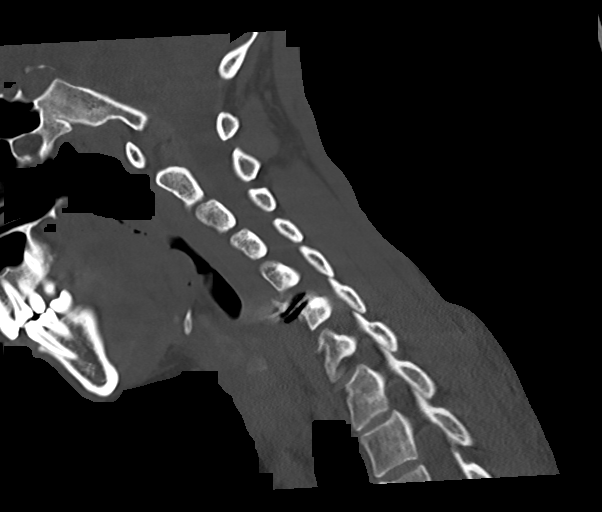
[im 63/95  bone]
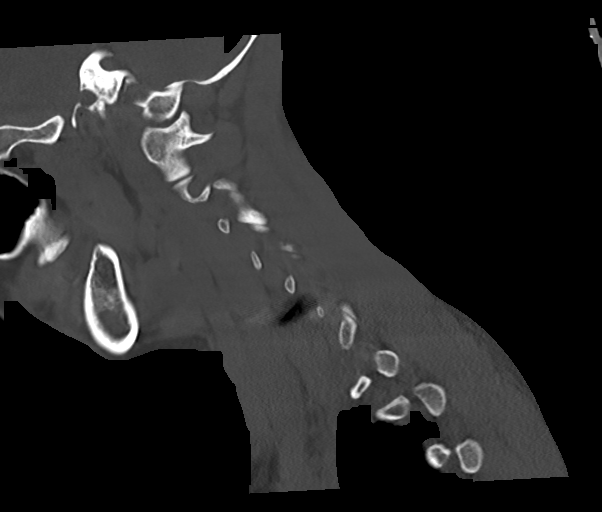

[Series 15: cor bone · coronal · 0.41mm/px · 3 of 83 slices shown]
[im 15/83  bone]
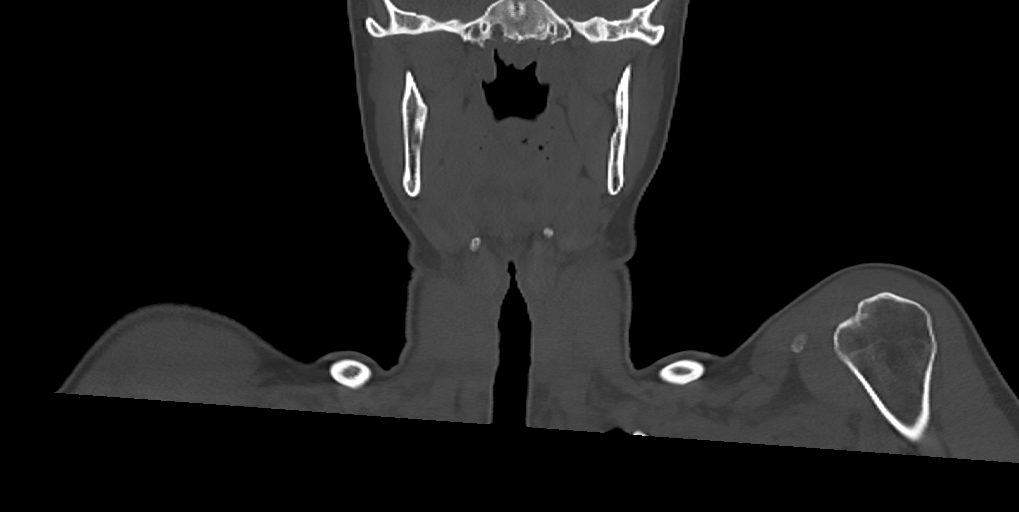
[im 38/83  bone]
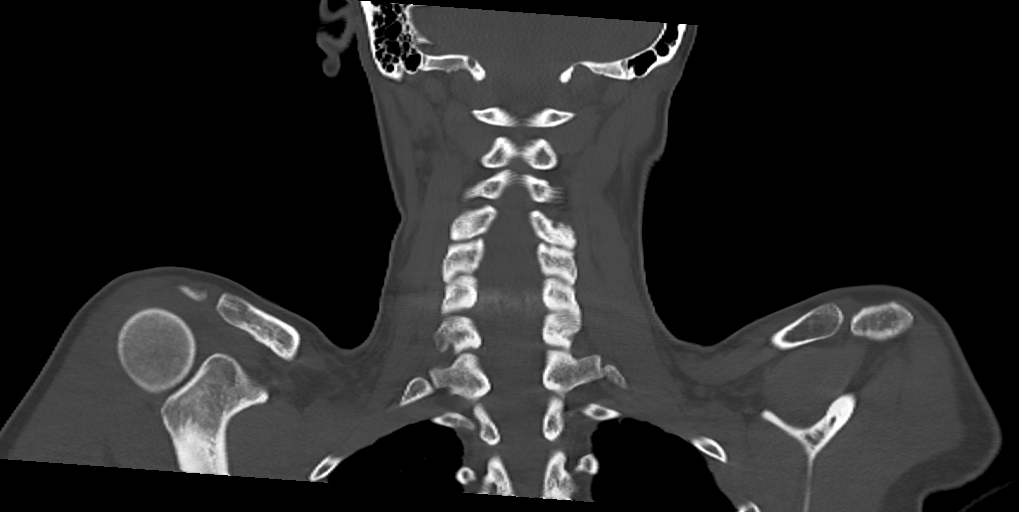
[im 61/83  bone]
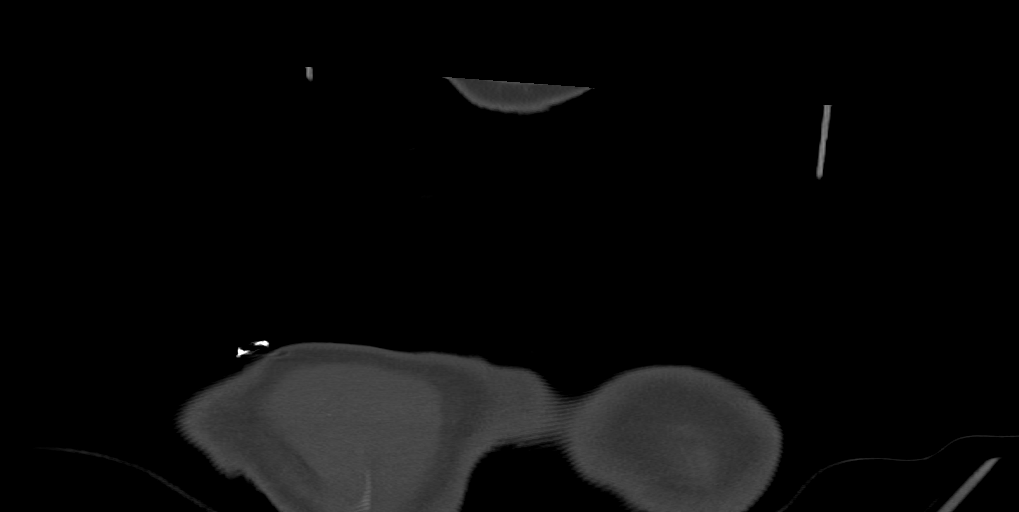

[12 of 34 positions shown; findings below may reference images not displayed]

FINDINGS: Alignment: Neck positioned flexion. Slight anterolisthesis of C5 on
C6.

Skull base and vertebrae: No acute fracture. No primary bone lesion
or focal pathologic process.

Soft tissues and spinal canal: No prevertebral fluid or swelling. No
visible canal hematoma.

Disc levels: Artificial disc has been placed at the C5-C6 level,
well-positioned, new since the prior CT. Remaining cervical discs
are well preserved in height. No disc bulging or disc herniation. No
stenosis.

Upper chest: Negative.

Other: None.
IMPRESSION: 1. No fracture or acute finding.
2. Cervical spine surgery since the prior cervical spine CT, with
placement of an artificial disc at C5-C6. Orthopedic hardware is
well positioned.
3. No other abnormality.
